# Patient Record
Sex: Male | Born: 1945
Health system: Southern US, Community
[De-identification: ages and names within clinical notes are randomized; demographics above are authoritative.]

## PROBLEM LIST (undated history)

## (undated) DIAGNOSIS — N200 Calculus of kidney: Secondary | ICD-10-CM

## (undated) DIAGNOSIS — L659 Nonscarring hair loss, unspecified: Secondary | ICD-10-CM

## (undated) DIAGNOSIS — I10 Essential (primary) hypertension: Secondary | ICD-10-CM

## (undated) DIAGNOSIS — M720 Palmar fascial fibromatosis [Dupuytren]: Secondary | ICD-10-CM

## (undated) DIAGNOSIS — K5790 Diverticulosis of intestine, part unspecified, without perforation or abscess without bleeding: Secondary | ICD-10-CM

## (undated) DIAGNOSIS — G4733 Obstructive sleep apnea (adult) (pediatric): Secondary | ICD-10-CM

## (undated) DIAGNOSIS — E785 Hyperlipidemia, unspecified: Secondary | ICD-10-CM

## (undated) DIAGNOSIS — Z9989 Dependence on other enabling machines and devices: Principal | ICD-10-CM

## (undated) DIAGNOSIS — G4730 Sleep apnea, unspecified: Secondary | ICD-10-CM

## (undated) HISTORY — DX: Essential (primary) hypertension: I10

## (undated) HISTORY — PX: OTHER SURGICAL HISTORY: SHX169

## (undated) HISTORY — DX: Nonscarring hair loss, unspecified: L65.9

## (undated) HISTORY — PX: COLONOSCOPY W/ POLYPECTOMY: SHX1380

## (undated) HISTORY — DX: Sleep apnea, unspecified: G47.30

## (undated) HISTORY — PX: WISDOM TOOTH EXTRACTION: SHX21

## (undated) HISTORY — DX: Dependence on other enabling machines and devices: Z99.89

## (undated) HISTORY — DX: Palmar fascial fibromatosis (dupuytren): M72.0

## (undated) HISTORY — DX: Calculus of kidney: N20.0

## (undated) HISTORY — DX: Obstructive sleep apnea (adult) (pediatric): G47.33

## (undated) HISTORY — DX: Hyperlipidemia, unspecified: E78.5

## (undated) HISTORY — DX: Diverticulosis of intestine, part unspecified, without perforation or abscess without bleeding: K57.90

---

## 1998-03-28 ENCOUNTER — Ambulatory Visit: Admission: RE | Admit: 1998-03-28 | Discharge: 1998-03-28 | Payer: Self-pay | Admitting: Otolaryngology

## 1999-02-04 ENCOUNTER — Encounter: Payer: Self-pay | Admitting: Internal Medicine

## 1999-02-04 ENCOUNTER — Ambulatory Visit (HOSPITAL_COMMUNITY): Admission: RE | Admit: 1999-02-04 | Discharge: 1999-02-04 | Payer: Self-pay | Admitting: Internal Medicine

## 2001-08-16 ENCOUNTER — Other Ambulatory Visit: Admission: RE | Admit: 2001-08-16 | Discharge: 2001-08-16 | Payer: Self-pay | Admitting: Gastroenterology

## 2001-08-16 ENCOUNTER — Encounter (INDEPENDENT_AMBULATORY_CARE_PROVIDER_SITE_OTHER): Payer: Self-pay | Admitting: Specialist

## 2004-09-04 ENCOUNTER — Ambulatory Visit: Payer: Self-pay | Admitting: Gastroenterology

## 2004-09-16 ENCOUNTER — Ambulatory Visit: Payer: Self-pay | Admitting: Gastroenterology

## 2005-03-20 ENCOUNTER — Ambulatory Visit: Payer: Self-pay | Admitting: Internal Medicine

## 2005-03-27 ENCOUNTER — Ambulatory Visit: Payer: Self-pay | Admitting: Internal Medicine

## 2005-09-29 ENCOUNTER — Ambulatory Visit: Payer: Self-pay | Admitting: Internal Medicine

## 2006-01-08 ENCOUNTER — Ambulatory Visit: Payer: Self-pay | Admitting: Internal Medicine

## 2006-04-20 ENCOUNTER — Ambulatory Visit: Payer: Self-pay | Admitting: Internal Medicine

## 2006-04-27 ENCOUNTER — Ambulatory Visit: Payer: Self-pay | Admitting: Internal Medicine

## 2007-01-11 ENCOUNTER — Ambulatory Visit: Payer: Self-pay | Admitting: Internal Medicine

## 2007-03-30 ENCOUNTER — Encounter: Payer: Self-pay | Admitting: Internal Medicine

## 2007-03-30 ENCOUNTER — Ambulatory Visit: Payer: Self-pay | Admitting: Internal Medicine

## 2007-03-30 LAB — CONVERTED CEMR LAB
Basophils Absolute: 0 10*3/uL (ref 0.0–0.1)
Basophils Relative: 0.2 % (ref 0.0–1.0)
Eosinophils Absolute: 0 10*3/uL (ref 0.0–0.6)
Eosinophils Relative: 0.3 % (ref 0.0–5.0)
HCT: 45 % (ref 39.0–52.0)
Hemoglobin: 15.4 g/dL (ref 13.0–17.0)
Lymphocytes Relative: 9.9 % — ABNORMAL LOW (ref 12.0–46.0)
MCHC: 34.3 g/dL (ref 30.0–36.0)
MCV: 88.4 fL (ref 78.0–100.0)
Monocytes Absolute: 0.7 10*3/uL (ref 0.2–0.7)
Monocytes Relative: 8.4 % (ref 3.0–11.0)
Neutro Abs: 7.1 10*3/uL (ref 1.4–7.7)
Neutrophils Relative %: 81.2 % — ABNORMAL HIGH (ref 43.0–77.0)
Platelets: 222 10*3/uL (ref 150–400)
RBC: 5.1 M/uL (ref 4.22–5.81)
RDW: 13.7 % (ref 11.5–14.6)
WBC: 8.7 10*3/uL (ref 4.5–10.5)

## 2007-04-05 ENCOUNTER — Telehealth (INDEPENDENT_AMBULATORY_CARE_PROVIDER_SITE_OTHER): Payer: Self-pay | Admitting: *Deleted

## 2007-04-08 ENCOUNTER — Ambulatory Visit: Payer: Self-pay | Admitting: Internal Medicine

## 2007-04-08 DIAGNOSIS — N433 Hydrocele, unspecified: Secondary | ICD-10-CM

## 2007-04-08 DIAGNOSIS — G4730 Sleep apnea, unspecified: Secondary | ICD-10-CM

## 2007-04-08 DIAGNOSIS — M720 Palmar fascial fibromatosis [Dupuytren]: Secondary | ICD-10-CM

## 2007-04-11 LAB — CONVERTED CEMR LAB
ALT: 45 units/L — ABNORMAL HIGH (ref 0–40)
AST: 25 units/L (ref 0–37)
Basophils Absolute: 0 10*3/uL (ref 0.0–0.1)
Basophils Relative: 0.6 % (ref 0.0–1.0)
Cholesterol: 246 mg/dL (ref 0–200)
Direct LDL: 156.4 mg/dL
Eosinophils Absolute: 0.2 10*3/uL (ref 0.0–0.6)
Eosinophils Relative: 2.5 % (ref 0.0–5.0)
HCT: 42.4 % (ref 39.0–52.0)
HDL: 33.8 mg/dL — ABNORMAL LOW (ref >39.0)
Hemoglobin: 14.5 g/dL (ref 13.0–17.0)
Hgb A1c MFr Bld: 5.9 % (ref 4.6–6.0)
Lymphocytes Relative: 35.7 % (ref 12.0–46.0)
MCHC: 34.2 g/dL (ref 30.0–36.0)
MCV: 89.6 fL (ref 78.0–100.0)
Monocytes Absolute: 0.6 10*3/uL (ref 0.2–0.7)
Monocytes Relative: 7.7 % (ref 3.0–11.0)
Neutro Abs: 4 10*3/uL (ref 1.4–7.7)
Neutrophils Relative %: 53.5 % (ref 43.0–77.0)
PSA: 0.28 ng/mL (ref 0.10–4.00)
Platelets: 342 10*3/uL (ref 150–400)
RBC: 4.73 M/uL (ref 4.22–5.81)
RDW: 13.4 % (ref 11.5–14.6)
TSH: 1.13 microintl units/mL (ref 0.35–5.50)
Total CHOL/HDL Ratio: 7.3
Triglycerides: 201 mg/dL (ref 0–149)
VLDL: 40 mg/dL (ref 0–40)
WBC: 7.4 10*3/uL (ref 4.5–10.5)

## 2007-04-19 ENCOUNTER — Telehealth: Payer: Self-pay | Admitting: Internal Medicine

## 2007-04-20 ENCOUNTER — Ambulatory Visit: Payer: Self-pay | Admitting: Internal Medicine

## 2007-04-20 DIAGNOSIS — M722 Plantar fascial fibromatosis: Secondary | ICD-10-CM

## 2007-04-22 ENCOUNTER — Ambulatory Visit: Payer: Self-pay | Admitting: Internal Medicine

## 2007-07-01 ENCOUNTER — Ambulatory Visit: Payer: Self-pay | Admitting: Internal Medicine

## 2007-07-05 LAB — CONVERTED CEMR LAB
ALT: 33 units/L (ref 0–53)
AST: 25 units/L (ref 0–37)
Cholesterol: 158 mg/dL (ref 0–200)
HDL: 41 mg/dL (ref >39.0)
LDL Cholesterol: 100 mg/dL — ABNORMAL HIGH (ref 0–99)
Total CHOL/HDL Ratio: 3.9
Triglycerides: 85 mg/dL (ref 0–149)
VLDL: 17 mg/dL (ref 0–40)

## 2007-07-06 ENCOUNTER — Encounter (INDEPENDENT_AMBULATORY_CARE_PROVIDER_SITE_OTHER): Payer: Self-pay | Admitting: *Deleted

## 2007-08-18 ENCOUNTER — Encounter: Payer: Self-pay | Admitting: Internal Medicine

## 2007-12-13 ENCOUNTER — Ambulatory Visit: Payer: Self-pay | Admitting: Gastroenterology

## 2007-12-27 ENCOUNTER — Ambulatory Visit: Payer: Self-pay | Admitting: Gastroenterology

## 2007-12-27 ENCOUNTER — Encounter: Payer: Self-pay | Admitting: Internal Medicine

## 2008-04-03 ENCOUNTER — Ambulatory Visit: Payer: Self-pay | Admitting: Internal Medicine

## 2008-04-04 ENCOUNTER — Encounter (INDEPENDENT_AMBULATORY_CARE_PROVIDER_SITE_OTHER): Payer: Self-pay | Admitting: *Deleted

## 2008-04-04 LAB — CONVERTED CEMR LAB
ALT: 33 units/L (ref 0–53)
AST: 33 units/L (ref 0–37)
Basophils Absolute: 0 10*3/uL (ref 0.0–0.1)
Basophils Relative: 0.6 % (ref 0.0–1.0)
Cholesterol: 164 mg/dL (ref 0–200)
Eosinophils Absolute: 0.1 10*3/uL (ref 0.0–0.7)
Eosinophils Relative: 2.4 % (ref 0.0–5.0)
HCT: 41.4 % (ref 39.0–52.0)
HDL: 41.7 mg/dL (ref >39.0)
Hemoglobin: 14.5 g/dL (ref 13.0–17.0)
Hgb A1c MFr Bld: 5.9 % (ref 4.6–6.0)
LDL Cholesterol: 107 mg/dL — ABNORMAL HIGH (ref 0–99)
Lymphocytes Relative: 33.7 % (ref 12.0–46.0)
MCHC: 34.9 g/dL (ref 30.0–36.0)
MCV: 89 fL (ref 78.0–100.0)
Monocytes Absolute: 0.5 10*3/uL (ref 0.1–1.0)
Monocytes Relative: 8.8 % (ref 3.0–12.0)
Neutro Abs: 3.1 10*3/uL (ref 1.4–7.7)
Neutrophils Relative %: 54.5 % (ref 43.0–77.0)
Platelets: 221 10*3/uL (ref 150–400)
RBC: 4.65 M/uL (ref 4.22–5.81)
RDW: 13.6 % (ref 11.5–14.6)
Total CHOL/HDL Ratio: 3.9
Triglycerides: 75 mg/dL (ref 0–149)
VLDL: 15 mg/dL (ref 0–40)
WBC: 5.6 10*3/uL (ref 4.5–10.5)

## 2008-04-05 ENCOUNTER — Ambulatory Visit: Payer: Self-pay | Admitting: Internal Medicine

## 2008-04-05 DIAGNOSIS — E785 Hyperlipidemia, unspecified: Secondary | ICD-10-CM

## 2008-08-17 ENCOUNTER — Encounter: Payer: Self-pay | Admitting: Internal Medicine

## 2008-08-18 ENCOUNTER — Telehealth (INDEPENDENT_AMBULATORY_CARE_PROVIDER_SITE_OTHER): Payer: Self-pay | Admitting: *Deleted

## 2008-08-22 ENCOUNTER — Ambulatory Visit: Payer: Self-pay | Admitting: Internal Medicine

## 2008-08-22 DIAGNOSIS — M109 Gout, unspecified: Secondary | ICD-10-CM

## 2008-08-26 LAB — CONVERTED CEMR LAB
Albumin: 4 g/dL (ref 3.5–5.2)
BUN: 19 mg/dL (ref 6–23)
CO2: 31 meq/L (ref 19–32)
Calcium: 9 mg/dL (ref 8.4–10.5)
Chloride: 104 meq/L (ref 96–112)
Creatinine, Ser: 1.1 mg/dL (ref 0.4–1.5)
GFR calc Af Amer: 87 mL/min
GFR calc non Af Amer: 72 mL/min
Glucose, Bld: 105 mg/dL — ABNORMAL HIGH (ref 70–99)
Phosphorus: 3.3 mg/dL (ref 2.3–4.6)
Potassium: 4.9 meq/L (ref 3.5–5.1)
Sodium: 141 meq/L (ref 135–145)
Uric Acid, Serum: 6.9 mg/dL (ref 4.0–7.8)

## 2008-08-29 ENCOUNTER — Encounter (INDEPENDENT_AMBULATORY_CARE_PROVIDER_SITE_OTHER): Payer: Self-pay | Admitting: *Deleted

## 2008-09-30 ENCOUNTER — Encounter: Payer: Self-pay | Admitting: Internal Medicine

## 2009-04-04 ENCOUNTER — Telehealth (INDEPENDENT_AMBULATORY_CARE_PROVIDER_SITE_OTHER): Payer: Self-pay | Admitting: *Deleted

## 2009-04-04 ENCOUNTER — Ambulatory Visit: Payer: Self-pay | Admitting: Internal Medicine

## 2009-05-09 ENCOUNTER — Ambulatory Visit: Payer: Self-pay | Admitting: Internal Medicine

## 2009-05-09 LAB — CONVERTED CEMR LAB
ALT: 38 units/L (ref 0–53)
AST: 26 units/L (ref 0–37)
Albumin: 4.1 g/dL (ref 3.5–5.2)
Alkaline Phosphatase: 58 units/L (ref 39–117)
BUN: 23 mg/dL (ref 6–23)
Basophils Absolute: 0.1 10*3/uL (ref 0.0–0.1)
Basophils Relative: 1.3 % (ref 0.0–3.0)
Bilirubin Urine: NEGATIVE
Bilirubin, Direct: 0.1 mg/dL (ref 0.0–0.3)
Blood in Urine, dipstick: NEGATIVE
CO2: 29 meq/L (ref 19–32)
Calcium: 9.3 mg/dL (ref 8.4–10.5)
Chloride: 108 meq/L (ref 96–112)
Cholesterol: 156 mg/dL (ref 0–200)
Creatinine, Ser: 1.2 mg/dL (ref 0.4–1.5)
Eosinophils Absolute: 0.1 10*3/uL (ref 0.0–0.7)
Eosinophils Relative: 2 % (ref 0.0–5.0)
GFR calc non Af Amer: 65.02 mL/min (ref >60)
Glucose, Bld: 106 mg/dL — ABNORMAL HIGH (ref 70–99)
Glucose, Urine, Semiquant: NEGATIVE
HCT: 40.7 % (ref 39.0–52.0)
HDL: 44.8 mg/dL (ref >39.00)
Hemoglobin: 14.3 g/dL (ref 13.0–17.0)
Ketones, urine, test strip: NEGATIVE
LDL Cholesterol: 87 mg/dL (ref 0–99)
Lymphocytes Relative: 32.9 % (ref 12.0–46.0)
Lymphs Abs: 2 10*3/uL (ref 0.7–4.0)
MCHC: 35.2 g/dL (ref 30.0–36.0)
MCV: 90.5 fL (ref 78.0–100.0)
Monocytes Absolute: 0.4 10*3/uL (ref 0.1–1.0)
Monocytes Relative: 7.2 % (ref 3.0–12.0)
Neutro Abs: 3.6 10*3/uL (ref 1.4–7.7)
Neutrophils Relative %: 56.6 % (ref 43.0–77.0)
Nitrite: NEGATIVE
PSA: 0.17 ng/mL (ref 0.10–4.00)
Platelets: 229 10*3/uL (ref 150.0–400.0)
Potassium: 4.9 meq/L (ref 3.5–5.1)
Protein, U semiquant: NEGATIVE
RBC: 4.5 M/uL (ref 4.22–5.81)
RDW: 13.4 % (ref 11.5–14.6)
Sodium: 144 meq/L (ref 135–145)
Specific Gravity, Urine: 1.015
TSH: 0.84 microintl units/mL (ref 0.35–5.50)
Total Bilirubin: 1 mg/dL (ref 0.3–1.2)
Total CHOL/HDL Ratio: 3
Total Protein: 7.1 g/dL (ref 6.0–8.3)
Triglycerides: 121 mg/dL (ref 0.0–149.0)
Urobilinogen, UA: 0.2
VLDL: 24.2 mg/dL (ref 0.0–40.0)
WBC Urine, dipstick: NEGATIVE
WBC: 6.2 10*3/uL (ref 4.5–10.5)
pH: 6.5

## 2009-05-17 ENCOUNTER — Ambulatory Visit: Payer: Self-pay | Admitting: Internal Medicine

## 2009-05-17 DIAGNOSIS — K5730 Diverticulosis of large intestine without perforation or abscess without bleeding: Secondary | ICD-10-CM | POA: Insufficient documentation

## 2009-05-17 DIAGNOSIS — I10 Essential (primary) hypertension: Secondary | ICD-10-CM

## 2009-05-17 DIAGNOSIS — R7309 Other abnormal glucose: Secondary | ICD-10-CM | POA: Insufficient documentation

## 2009-05-17 HISTORY — DX: Essential (primary) hypertension: I10

## 2009-05-18 ENCOUNTER — Encounter: Payer: Self-pay | Admitting: Internal Medicine

## 2009-09-10 ENCOUNTER — Telehealth (INDEPENDENT_AMBULATORY_CARE_PROVIDER_SITE_OTHER): Payer: Self-pay | Admitting: *Deleted

## 2009-09-11 ENCOUNTER — Ambulatory Visit: Payer: Self-pay | Admitting: Internal Medicine

## 2009-09-11 DIAGNOSIS — M6740 Ganglion, unspecified site: Secondary | ICD-10-CM | POA: Insufficient documentation

## 2009-12-05 ENCOUNTER — Ambulatory Visit: Payer: Self-pay | Admitting: Internal Medicine

## 2009-12-10 ENCOUNTER — Telehealth: Payer: Self-pay | Admitting: Internal Medicine

## 2010-03-11 ENCOUNTER — Encounter: Payer: Self-pay | Admitting: Internal Medicine

## 2010-05-29 ENCOUNTER — Telehealth (INDEPENDENT_AMBULATORY_CARE_PROVIDER_SITE_OTHER): Payer: Self-pay | Admitting: *Deleted

## 2010-06-17 ENCOUNTER — Telehealth: Payer: Self-pay | Admitting: Internal Medicine

## 2010-09-25 ENCOUNTER — Ambulatory Visit: Payer: Self-pay | Admitting: Internal Medicine

## 2010-09-25 LAB — CONVERTED CEMR LAB
Basophils Relative: 0.6 % (ref 0.0–3.0)
Eosinophils Relative: 2.4 % (ref 0.0–5.0)
HCT: 41.3 % (ref 39.0–52.0)
Hgb A1c MFr Bld: 5.9 % (ref 4.6–6.5)
Lymphs Abs: 1.9 10*3/uL (ref 0.7–4.0)
MCV: 90.7 fL (ref 78.0–100.0)
Monocytes Absolute: 0.5 10*3/uL (ref 0.1–1.0)
Monocytes Relative: 7.8 % (ref 3.0–12.0)
Platelets: 213 10*3/uL (ref 150.0–400.0)
RBC: 4.55 M/uL (ref 4.22–5.81)
WBC: 6.4 10*3/uL (ref 4.5–10.5)

## 2010-09-28 LAB — CONVERTED CEMR LAB
ALT: 28 units/L (ref 0–53)
AST: 21 units/L (ref 0–37)
Albumin: 4.4 g/dL (ref 3.5–5.2)
Alkaline Phosphatase: 49 units/L (ref 39–117)
BUN: 22 mg/dL (ref 6–23)
Bilirubin, Direct: 0.1 mg/dL (ref 0.0–0.3)
CO2: 21 meq/L (ref 19–32)
Calcium: 9.2 mg/dL (ref 8.4–10.5)
Chloride: 106 meq/L (ref 96–112)
Cholesterol: 164 mg/dL (ref 0–200)
Creatinine, Ser: 1.18 mg/dL (ref 0.40–1.50)
Glucose, Bld: 96 mg/dL (ref 70–99)
HDL: 48 mg/dL (ref >39)
Indirect Bilirubin: 0.4 mg/dL (ref 0.0–0.9)
LDL Cholesterol: 89 mg/dL (ref 0–99)
Potassium: 4.5 meq/L (ref 3.5–5.3)
Sodium: 143 meq/L (ref 135–145)
TSH: 1.373 microintl units/mL (ref 0.350–4.500)
Total Bilirubin: 0.5 mg/dL (ref 0.3–1.2)
Total CHOL/HDL Ratio: 3.4
Total Protein: 6.7 g/dL (ref 6.0–8.3)
Triglycerides: 136 mg/dL (ref <150)
Uric Acid, Serum: 7.5 mg/dL (ref 4.0–7.8)
VLDL: 27 mg/dL (ref 0–40)

## 2010-10-01 ENCOUNTER — Ambulatory Visit: Payer: Self-pay | Admitting: Internal Medicine

## 2010-10-01 DIAGNOSIS — Z87442 Personal history of urinary calculi: Secondary | ICD-10-CM

## 2010-10-01 DIAGNOSIS — M7660 Achilles tendinitis, unspecified leg: Secondary | ICD-10-CM | POA: Insufficient documentation

## 2010-10-02 ENCOUNTER — Encounter: Payer: Self-pay | Admitting: Internal Medicine

## 2010-12-01 LAB — CONVERTED CEMR LAB
Cholesterol, target level: 200 mg/dL
HDL goal, serum: 40 mg/dL
LDL Goal: 100 mg/dL

## 2010-12-03 NOTE — Assessment & Plan Note (Signed)
Summary: cough and congestion for 2 weeks//lh   Vital Signs:  Patient profile:   65 year old male Weight:      237 pounds Temp:     98.4 degrees F oral Pulse rate:   80 / minute Resp:     15 per minute BP sitting:   120 / 70  (left arm) Cuff size:   large  Vitals Entered By: Shonna Chock (December 05, 2009 2:43 PM) CC: Cough and congestion x 2 weeks  Comments REVIEWED MED LIST, PATIENT AGREED DOSE AND INSTRUCTION CORRECT    CC:  Cough and congestion x 2 weeks .  History of Present Illness: Onset 1-2 weeks as NP cough, ? from PNDrainage. His wife had same picture. He did take Flu shot.Rx: Mucinex DM. No PMH of asthma  Allergies (verified): No Known Drug Allergies  Review of Systems General:  Complains of sweats; denies chills and fever. ENT:  Complains of postnasal drainage; denies earache, nasal congestion, and sinus pressure; No facial pain , frontal headache, or purulence. Resp:  Denies chest pain with inspiration, shortness of breath, and wheezing.  Physical Exam  General:  well-nourished,in no acute distress; alert,appropriate and cooperative throughout examination Ears:  External ear exam shows no significant lesions or deformities.  Otoscopic examination reveals clear canals, tympanic membranes are intact bilaterally without bulging, retraction, inflammation or discharge. Hearing is grossly normal bilaterally.TMs dull Nose:  External nasal examination shows no deformity or inflammation. Nasal mucosa are  dry without lesions or exudates. Mouth:  Oral mucosa and oropharynx without lesions or exudates.  Teeth in good repair. Minimal pharyngeal erythema.   Lungs:  Normal respiratory effort, chest expands symmetrically. Lungs are clear to auscultation, no crackles or wheezes but dry cough. Cervical Nodes:  No lymphadenopathy noted Axillary Nodes:  No palpable lymphadenopathy   Impression & Recommendations:  Problem # 1:  BRONCHITIS-ACUTE (ICD-466.0)  His updated  medication list for this problem includes:    Azithromycin 250 Mg Tabs (Azithromycin) .Marland Kitchen... As per pack  Complete Medication List: 1)  Propecia Tabs (Finasteride tabs) 2)  Fish Oil  .... 2 caps qd 3)  Fiber  .... 2 caps qd 4)  Glucosamine  5)  Calcium  6)  Asa 81mg   .... 1 by mouth qd 7)  Multivitamin  .Marland Kitchen.. 1 by mouth qd 8)  Pravastatin Sodium 40 Mg Tabs (Pravastatin sodium) .Marland Kitchen.. 1 at bedtime 9)  Benazepril Hcl 20 Mg Tabs (Benazepril hcl) .Marland Kitchen.. 1 once daily if bp averages > 130/85 10)  Finasteride 5 Mg Tabs (Finasteride) .... Once daily as directed 11)  Azithromycin 250 Mg Tabs (Azithromycin) .... As per pack 12)  Fluticasone Propionate 50 Mcg/act Susp (Fluticasone propionate) .Marland Kitchen.. 1 spray two times a day  Patient Instructions: 1)  Drink as much fluid as you can tolerate for the next few days. Prescriptions: FLUTICASONE PROPIONATE 50 MCG/ACT SUSP (FLUTICASONE PROPIONATE) 1 spray two times a day  #1 x 11   Entered and Authorized by:   Marga Melnick MD   Signed by:   Marga Melnick MD on 12/05/2009   Method used:   Faxed to ...       [facility]. [phone]* (retail)       [address]       [place], [place]  [address]       Ph: [phone]       Fax: [phone]   RxID:   [id number] AZITHROMYCIN  250 MG TABS (AZITHROMYCIN) as per pack  #1 x 0   Entered and Authorized by:   Marga Melnick MD   Signed by:   Marga Melnick MD on 12/05/2009   Method used:   Faxed to ...       [facility]. [phone]* (retail)       [address]       [place], [place]  [address]       Ph: [phone]       Fax: [phone]   RxID:   [id number]

## 2010-12-03 NOTE — Assessment & Plan Note (Signed)
Summary: CPX///SPH   Vital Signs:  Patient profile:   65 year old male Height:      72 inches Weight:      233.2 pounds BMI:     31.74 Temp:     98.7 degrees F oral Pulse rate:   88 / minute Resp:     16 per minute BP sitting:   136 / 80  (left arm) Cuff size:   large  Vitals Entered By: Shonna Chock CMA (October 01, 2010 8:44 AM) CC: CPX and discuss labs (copy given)    CC:  CPX and discuss labs (copy given) .  History of Present Illness:     Mr. Soulier is here for a physical; he has had L Achilles area  pain . [facility] diagnosed torn muscle or tendon. MRI was cancelled when it improved , but it has recurred with resuming walking 60 min 4X/ week. He is taking 3000 mg of  Glucosamine daily.    Hyperlipidemia Follow-Up:  The patient denies muscle aches, GI upset, abdominal pain, flushing, itching, constipation, diarrhea, and fatigue.  The patient denies the following symptoms: chest pain/pressure, exercise intolerance, dypsnea, palpitations, syncope, and pedal edema.  Compliance with medications (by patient report) has been near 100%.  Dietary compliance has been fair.  The patient reports exercising  until the flare of the  Achilles symptoms 2 weeks ago .  Adjunctive measures currently used by the patient include fiber, ASA, and fish oil supplements.     Hypertension Follow-Up:  The patient denies lightheadedness, urinary frequency, and headaches.  Compliance with medications (by patient report) has been near 100%.  Adjunctive measures currently used by the patient include modified  salt restriction.  BP not monitored @ home; it has been 130-140/75-85 @ MD appts.  Current Medications (verified): 1)  Fish Oil .... 2 Caps Qd 2)  Fiber .... 2 Caps Qd 3)  Glucosamine 4)  Calcium 5)  Asa 81mg  .... 1 By Mouth Qd 6)  Multivitamin .Marland Kitchen.. 1 By Mouth Qd 7)  Pravastatin Sodium 40 Mg  Tabs (Pravastatin Sodium) .Marland Kitchen.. 1 At Bedtime, **appointment Due** 8)  Benazepril Hcl 20 Mg Tabs  (Benazepril Hcl) .Marland Kitchen.. 1 Once Daily If Bp Averages > 130/85 9)  Finasteride 5 Mg Tabs (Finasteride) .... Once Daily As Directed  Allergies: No Known Drug Allergies  Past History:  Past Medical History: CPAP for Sleep Apnea Dupuytren's contracture RUE  Hyperlipidemia: LDL goal = < 100 Gout Diverticulosis, colon Nephrolithiasis, hx of, 1984 & 2011, Dr Aldean Ast  Past Surgical History: Colon polypectomy X 2; negative  10/2007, Dr Jarold Motto ( due 2013) renal calculus retrieved via basket 1984;stone passed spontaneously 2011; Otic tube R TM  Family History: MGM: HTN, CVA;MGF : lung cancer; P & M uncle : MI  (neither pre 46) Father:  lung & throat  cancer Mother: uremia from medications ([place]) Siblings: negative  Social History: Former Games developer, former smokeless tobacco user Alcohol use-yes-socially Occupation:Executive Married Regular exercise-yes: 4X/week X 1 hr  Review of Systems  The patient denies anorexia, fever, vision loss, decreased hearing, hoarseness, prolonged cough, hemoptysis, abdominal pain, melena, hematochezia, severe indigestion/heartburn, hematuria, suspicious skin lesions, depression, unusual weight change, abnormal bleeding, enlarged lymph nodes, and angioedema.         Weigh gain of 5 #.  Physical Exam  General:  well-nourished;alert,appropriate and cooperative throughout examination Head:  Normocephalic and atraumatic without obvious abnormalities.  Eyes:  No corneal or conjunctival inflammation noted.  Perrla. Funduscopic exam benign, without  hemorrhages, exudates or papilledema. Ears:  External ear exam shows no significant lesions or deformities.  Otoscopic examination reveals clear canals, tympanic membranes are intact bilaterally without bulging, retraction, inflammation or discharge. R TM scarred. Hearing is grossly normal bilaterally. Nose:  External nasal examination shows no deformity or inflammation. Nasal mucosa are pink and moist without  lesions or exudates. Mouth:  Oral mucosa and oropharynx without lesions or exudates.  Teeth in good repair. Neck:  No deformities, masses, or tenderness noted. Lungs:  Normal respiratory effort, chest expands symmetrically. Lungs are clear to auscultation, no crackles or wheezes. Heart:  Normal rate and regular rhythm. S1 and S2 normal without gallop, murmur, click, rub. S4  Abdomen:  Bowel sounds positive,abdomen soft and non-tender without masses, organomegaly or hernias noted. Genitalia:  Dr Aldean Ast Msk:  No deformity or scoliosis noted of thoracic or lumbar spine.   Pulses:  R and L carotid,radial,dorsalis pedis and posterior tibial pulses are full and equal bilaterally Extremities:  No clubbing, cyanosis, edema. Slightly tender mass L Achilles tendon, ? ganglion. Dupuytren's R thumb base Neurologic:  alert & oriented X3 and DTRs symmetrical and normal.   Skin:  Intact without suspicious lesions or rashes Cervical Nodes:  No lymphadenopathy noted Axillary Nodes:  No palpable lymphadenopathy Psych:  memory intact for recent and remote, normally interactive, and good eye contact.     Impression & Recommendations:  Problem # 1:  ROUTINE GENERAL MEDICAL EXAM@HEALTH  CARE FACL (ICD-V70.0)  Orders: EKG w/ Interpretation (93000)  Problem # 2:  ACHILLES TENDINITIS (ICD-726.71) vs ganglion  Problem # 3:  UNSPECIFIED ESSENTIAL HYPERTENSION (ICD-401.9) controlled His updated medication list for this problem includes:    Benazepril Hcl 20 Mg Tabs (Benazepril hcl) .Marland Kitchen... 1 once daily  Problem # 4:  HYPERLIPIDEMIA (ICD-272.4)  His updated medication list for this problem includes:    Pravastatin Sodium 40 Mg Tabs (Pravastatin sodium) .Marland Kitchen... 1 at bedtime  Problem # 5:  GOUT (ICD-274.9) PMH of, uric acid 7.5  Problem # 6:  NEPHROLITHIASIS, HX OF (ICD-V13.01)  Complete Medication List: 1)  Fish Oil  .... 2 caps qd 2)  Fiber  .... 2 caps qd 3)  Glucosamine  4)  Calcium  5)  Asa 81mg    .... 1 by mouth qd 6)  Multivitamin  .Marland Kitchen.. 1 by mouth qd 7)  Pravastatin Sodium 40 Mg Tabs (Pravastatin sodium) .Marland Kitchen.. 1 at bedtime 8)  Benazepril Hcl 20 Mg Tabs (Benazepril hcl) .Marland Kitchen.. 1 once daily 9)  Finasteride 5 Mg Tabs (Finasteride) .... Once daily as directed 10)  Allopurinol 100 Mg Tabs (allopurinol)  .Marland Kitchen.. 1 daily  Patient Instructions: 1)  Check uric acid level in 3-4 months (274.9) on  low dose Allopurinol. 2)  Check your Blood Pressure regularly. If it is above: 135/85 ON AVERAGE you should make an appointment. Prescriptions: ALLOPURINOL 100 MG TABS (ALLOPURINOL) 1 daily  #90 x 1   Entered and Authorized by:   Marga Melnick MD   Signed by:   Marga Melnick MD on 10/01/2010   Method used:   Print then Give to Patient   RxID:   [id number] BENAZEPRIL HCL 20 MG TABS (BENAZEPRIL HCL) 1 once daily  #90 x 3   Entered and Authorized by:   Marga Melnick MD   Signed by:   Marga Melnick MD on 10/01/2010   Method used:   Print then Give to Patient   RxID:   [id number] PRAVASTATIN SODIUM 40 MG  TABS (PRAVASTATIN SODIUM) 1 at bedtime  #  90 x 3   Entered and Authorized by:   Marga Melnick MD   Signed by:   Marga Melnick MD on 10/01/2010   Method used:   Print then Give to Patient   RxID:   [id number]    Orders Added: 1)  Est. Patient 40-64 years [99396] 2)  EKG w/ Interpretation [93000]

## 2010-12-03 NOTE — Progress Notes (Signed)
Summary: RX-request chest congestion  z-pak  Phone Note Call from Patient Call back at [phone]   Caller: Patient Summary of Call: pt left VM that he current has alot of chest congestion and would like for dr Areg Bialas to rx a z-pak for him. pt states that he currently out of town and would need rx sent to [facility]. Called pt back to inform him med cannot be rx withoul [facility] and he would need to be seen at local UC  for Z-pak to be rx. pt states that dr Tiphani Mells [place] and would prefer i speak with him first beacuse he is a friend of dr Blimi Godby. pls advise................Marland KitchenFelecia Deloach CMA  June 17, 2010 10:44 AM   Follow-up for Phone Call        per dr Brehanna Deveny ok generic z-pak OVINB.........Marland KitchenFelecia Deloach CMA  June 17, 2010 2:39 PM   pt aware rx faxed [phone] ph [phone]............Marland KitchenFelecia Deloach CMA  June 17, 2010 2:47 PM     New/Updated Medications: ZITHROMAX Z-PAK 250 MG TABS (AZITHROMYCIN) as directed Prescriptions: ZITHROMAX Z-PAK 250 MG TABS (AZITHROMYCIN) as directed  #1 x 0   Entered by:   Jeremy Johann CMA   Authorized by:   Marga Melnick MD   Signed by:   Jeremy Johann CMA on 06/17/2010   Method used:   Printed then faxed to ...       [facility]. [phone]* (retail)       [address]       [place], [place]  [address]       Ph: [phone]       Fax: [phone]   RxID:   [id number]

## 2010-12-03 NOTE — Progress Notes (Signed)
Summary: NEEDS CPX FASTING LABS --entered  Phone Note Call from Patient   Caller: Patient Summary of Call: PATIENT HAS CPX ON 11/29 AND HAS FASTING LABS FOR 11/23----WHAT FASTING LABS WILL HE NEED?  LET ME KNOW AND I WILL ENTER IT ON HIS LAB  Initial call taken by: Jerolyn Shin,  May 29, 2010 2:33 PM  Follow-up for Phone Call        V70.0, 401.9, 274.9, 790.29: lipids, hep panel, BMET, CBC& dif, TSH, A1c, uric acid, PSA Follow-up by: Marga Melnick MD,  May 29, 2010 3:45 PM  Additional Follow-up for Phone Call Additional follow up Details #1::        entered lab info for 11/23 Additional Follow-up by: Jerolyn Shin,  May 29, 2010 5:39 PM

## 2010-12-03 NOTE — Letter (Signed)
Summary: [facility]  [facility]   Imported By: Lanelle Bal 03/18/2010 14:01:58  _____________________________________________________________________  External Attachment:    Type:   Image     Comment:   External Document

## 2010-12-03 NOTE — Progress Notes (Signed)
Summary: COUGHING  Phone Note Call from Patient Call back at [phone]   Caller: Patient Reason for Call: Talk to Nurse Summary of Call: PT CALL AND SAID THAT HE STARTED  COUGHING SO BAD THAT HE PAST OUT AND WANT TO KNOW WHAT HE SHOULD DO ABOUT THIS AND WANT TO TALK TO DR. Catelynn Sparger ABOUT THIS. HE WAS IN ONE DAY LAST WEEK TO SEE DR. HOP  FOTR COUGHING. Initial call taken by: Freddy Jaksch,  December 10, 2009 11:47 AM  Follow-up for Phone Call        pt states that he was seen on 12-05-09 for BRONCHITIS and has finished the z-pak that was rx. pt still having cough pls advise..............Marland KitchenFelecia Deloach CMA  December 10, 2009 1:14 PM   Additional Follow-up for Phone Call Additional follow up Details #1::        Zithromax will be in system for 3 more days. See Rx for cough syrup Additional Follow-up by: Marga Melnick MD,  December 10, 2009 1:30 PM    Additional Follow-up for Phone Call Additional follow up Details #2::    per pt did not need rx already had one from [facility] that was hand written from dr Jadalynn Burr pt will continue with med and if no better will call...............Marland KitchenFelecia Deloach CMA  December 10, 2009 2:18 PM   New/Updated Medications: PROMETHAZINE VC/CODEINE 6.25-5-10 MG/5ML SYRP (PHENYLEPH-PROMETHAZINE-COD) 1 tsp q 6 hrs prn Prescriptions: PROMETHAZINE VC/CODEINE 6.25-5-10 MG/5ML SYRP (PHENYLEPH-PROMETHAZINE-COD) 1 tsp q 6 hrs prn  #120cc x 0   Entered and Authorized by:   Marga Melnick MD   Signed by:   Marga Melnick MD on 12/10/2009   Method used:   Printed then faxed to ...       [facility]. [phone]* (retail)       [address]       [place], [place]  [address]       Ph: [phone]       Fax: [phone]   RxID:   [id number]

## 2010-12-25 ENCOUNTER — Telehealth (INDEPENDENT_AMBULATORY_CARE_PROVIDER_SITE_OTHER): Payer: Self-pay | Admitting: *Deleted

## 2010-12-31 NOTE — Progress Notes (Signed)
Summary: PSA  Phone Note Call from Patient Call back at [phone]   Caller: Patient Summary of Call: Patient is calling about his psa. Was it done and why wasn't it done. Initial call taken by: Freddy Jaksch,  December 25, 2010 9:58 AM  Follow-up for Phone Call        I see this was 0.17 in 2010. Please advise. Lucious Groves CMA  December 25, 2010 10:06 AM   Additional Follow-up for Phone Call Additional follow up Details #1::        I thought Dr Vic Blackbird had done this when seen in 2011; it was last done here 05/2009 Additional Follow-up by: Marga Melnick MD,  December 25, 2010 11:26 AM    Additional Follow-up for Phone Call Additional follow up Details #2::    I spoke with patient's wife and informed her of Dr.Hopper's response and she informed me to contact her Husband at his work number.   I called patient and work number [facility]) and then called cell ([phone] number) and spoke with patient. Patient stated he will call back to schedule PSA( v76.44).Shonna Chock CMA  December 25, 2010 11:43 AM

## 2011-03-21 NOTE — Assessment & Plan Note (Signed)
[facility]                        [facility] OFFICE NOTE   [name], PARKISON                    MRN:          [id number]  DATE:01/11/2007                            DOB:          31-Jan-1946    Danny Kennedy was seen on January 11, 2007, with Classic symptoms and signs  of rhinosinusitis. For two weeks he has had congestion and purulence in  both head and chest, greater from the sinuses. Cough has disturbed  sleep. He also has had associated nasal congestion and fatigue. He had  fever only on day 1. He denies headache, halitosis, nasal obstruction,  facial pain, anosmia, dental pain or earache.   He has treated himself appropriately with Mucinex and Delsym.   He has no known drug allergies.   He quit smoking in 1992.   Significant history of sleep apnea for which he uses CPAP.   Temperature was 97.6, weight 229-up 5 pounds, respiratory rate 15, blood  pressure 120/80.  He had full extraocular motion.  There is scarring of the right tympanic membrane; by history he had a  tube in this ear. The left tympanic membrane is slightly dull.  Nares are dry.  There is only minimal erythema of the posterior pharynx.  He has no lymphadenopathy  of the head, neck or axilla.  Chest was clear.   Augmentin 875 every 12 hours with a meal would be recommended. Continue  the Mucinex and forced oral hydration will be encouraged. For the nasal  obstruction, a NETI pot would be recommended. Tussionex will be provided  at bedtime to allow sleep.     Titus Dubin. Alwyn Ren, MD,FACP,FCCP  Electronically Signed    [facility]  DD: 01/11/2007  DT: 01/11/2007  Job #: [id number]

## 2011-04-22 ENCOUNTER — Other Ambulatory Visit: Payer: Self-pay | Admitting: Internal Medicine

## 2011-04-22 NOTE — Telephone Encounter (Signed)
Check uric acid level in 3-4 months (274.9) on  low dose Allopurinol.  Copied from 10/01/2010 patient instructions

## 2011-06-03 ENCOUNTER — Other Ambulatory Visit: Payer: Self-pay | Admitting: Internal Medicine

## 2011-07-14 ENCOUNTER — Telehealth: Payer: Self-pay | Admitting: Internal Medicine

## 2011-07-14 NOTE — Telephone Encounter (Signed)
Spoke with Pt who states that he needs to have a new mask for his sleep apnea machine. Pt note that he has recently change insurance and will contact them and return call tomorrow to clarify exactly what is needed from dr hopper.

## 2011-07-17 ENCOUNTER — Ambulatory Visit: Payer: Self-pay | Admitting: Internal Medicine

## 2011-07-21 ENCOUNTER — Encounter: Payer: Self-pay | Admitting: *Deleted

## 2011-07-21 ENCOUNTER — Ambulatory Visit (INDEPENDENT_AMBULATORY_CARE_PROVIDER_SITE_OTHER): Payer: Medicare Other | Admitting: Internal Medicine

## 2011-07-21 DIAGNOSIS — I10 Essential (primary) hypertension: Secondary | ICD-10-CM

## 2011-07-21 DIAGNOSIS — G4730 Sleep apnea, unspecified: Secondary | ICD-10-CM

## 2011-07-21 NOTE — Patient Instructions (Signed)
Your BP goal = AVERAGE < 135/85. Avoid ingestion of  excess salt/sodium.Cook with pepper & other spices . Use the salt substitute "No Salt"(unless your potassium has been elevated) OR the Mrs Sharilyn Sites products to season food @ the table. Avoid foods which taste salty or "vinegary" as their sodium contentet will be high. The best exercises for the low back include freestyle swimming, stretch aerobics, and yoga. The CPAP facemask.  Please take this record to support reevaluation by [employer] in  reference to CPAP mask

## 2011-07-21 NOTE — Progress Notes (Signed)
  Subjective:    Patient ID: Danny Kennedy, male    DOB: May 24, 1946, 65 y.o.   MRN: [id number]  HPI   Sleep apnea was documented on formal testing in 2002. In 2008 the pressure was increased from 7 cm to 11 cm because of suboptimal control of apnea. His sleep pattern is extremely good at this time, but he is noted an indentation of the subcutaneous tissues above the bridge of the nose over the past 6 months. He attributes this to the present mask he is using     Review of Systems he denies chest pain, palpitations, dyspnea, paroxysmal nocturnal dyspnea, or edema.   he is taking Celebrex from his Orthopedist  for pain from acute low back syndrome incurred last week. He feels this is raising BP      Objective:   Physical Exam he is a healthy in appearance a well-nourished  There is an indentation subcutaneously above the bridge of the nose. There is no evidence of cellulitis or rash.  He has no neck vein distention or hepatojugular reflux  Nares are patent without septal dislocation  Otic canals and tympanic membranes are normal except for scarring on the right  Dental hygiene is excellent; there is a crowding of the oropharynx  There is  regular rhythm without murmurs or gallops  Chest is clear to auscultation with no increased work of breathing.  No clubbing, cyanosis or edema present        Assessment & Plan:  #1 sleep apnea, excellent response to present settings  #2 subcutaneous tissue deficit due to to his present mask  #3 elevated blood pressure in context acute low back injury.   Plan: referral for equipment reassessment.

## 2011-09-17 ENCOUNTER — Encounter: Payer: Self-pay | Admitting: Internal Medicine

## 2011-09-18 ENCOUNTER — Encounter: Payer: Self-pay | Admitting: Internal Medicine

## 2011-09-18 ENCOUNTER — Ambulatory Visit (INDEPENDENT_AMBULATORY_CARE_PROVIDER_SITE_OTHER): Payer: Medicare Other | Admitting: Internal Medicine

## 2011-09-18 VITALS — BP 122/74 | HR 81 | Temp 98.7°F | Resp 12 | Ht 71.0 in | Wt 230.0 lb

## 2011-09-18 DIAGNOSIS — Z86010 Personal history of colonic polyps: Secondary | ICD-10-CM

## 2011-09-18 DIAGNOSIS — I10 Essential (primary) hypertension: Secondary | ICD-10-CM

## 2011-09-18 DIAGNOSIS — M545 Low back pain: Secondary | ICD-10-CM

## 2011-09-18 DIAGNOSIS — R7309 Other abnormal glucose: Secondary | ICD-10-CM

## 2011-09-18 DIAGNOSIS — Z0000 Encounter for general adult medical examination without abnormal findings: Secondary | ICD-10-CM

## 2011-09-18 DIAGNOSIS — M109 Gout, unspecified: Secondary | ICD-10-CM

## 2011-09-18 DIAGNOSIS — E785 Hyperlipidemia, unspecified: Secondary | ICD-10-CM

## 2011-09-18 NOTE — Progress Notes (Signed)
Subjective:    Patient ID: Danny Kennedy, male    DOB: [birth year], 65 y.o.   MRN: [id number]  HPI Medicare Wellness Visit:  The following psychosocial & medical history were reviewed as required by Medicare.   Social history: caffeine: 1 diet coke  , alcohol:  < 1 / day ,  tobacco use : quit smoking  1992 & smokeless tobacco 2008 ;   & exercise : walking 3-4 X/ week > 60 min.   Home & personal  safety / fall risk: no issues, activities of daily living: no limitations , seatbelt use : yes , and smoke alarm employment : yes .  Power of Attorney/Living Will status : in place  Vision ( as recorded per Nurse) & Hearing  evaluation :  Last week , early cataract.Whisper heard @ 6 ft; wall chart read @ 6 ft Orientation :oriented X 3 , memory & recall :good ,  math testing: good,and mood & affect : normal . Depression / anxiety: no issues Travel history : last 2009 [place] , immunization status :Flu not taken  , transfusion history:  no, and preventive health surveillance ( colonoscopies, BMD , etc as per protocol/ [facility]): colonoscopy up to date, Dental care:  Seen every 6 mos . Chart reviewed &  Updated. Active issues reviewed & addressed.       Review of Systems BACK  PAIN: Location: LS area   Onset: today   Severity: up to 7 Pain is described as: sharp  Worse with: straightening back    Better with: Celebrex  Pain radiates to: no   Impaired range of motion: yes History of repetitive motion:  no  History of overuse or hyperextension:  no  History of trauma:  no   Past history of similar problem:  yes, chronic / recurrent Symptoms Numbness/tingling:  no  Weakness:  no  Red Flags Fever:  no  Bowel/bladder dysfunction:  no       Objective:   Physical Exam Gen.: Healthy and well-nourished in appearance. Alert, appropriate and cooperative throughout exam. Head: Normocephalic without obvious abnormalities  Eyes: No corneal or conjunctival inflammation noted. Pupils equal round  reactive to light and accommodation. Fundal exam is benign without hemorrhages, exudate, papilledema. Extraocular motion intact.  Ears: R TM scarred  Nose: External nasal exam reveals no deformity or inflammation. Nasal mucosa are pink and moist. No lesions or exudates noted. Septum  Minimally dislocated  Mouth: Oral mucosa and oropharynx reveal no lesions or exudates. Teeth in good repair. Neck: No deformities, masses, or tenderness noted. Range of motion &. Thyroid  normal. Lungs: Normal respiratory effort; chest expands symmetrically. Lungs are clear to auscultation without rales, wheezes, or increased work of breathing. Heart: Normal rate and rhythm. Normal S1 and S2. No gallop, click, or rub. S 4 without  murmur. Abdomen: Bowel sounds normal; abdomen soft and nontender. No masses, organomegaly or hernias noted. Genitalia/ DRE: He has varicoele in the left scrotum. The prostate is broad and flat without asymmetry,enlargement, nodularity, or induration.    Marland Kitchen  Musculoskeletal/extremities: No deformity or scoliosis noted of  the thoracic or lumbar spine. No clubbing, cyanosis, edema, or deformity noted. Range of motion  normal .Tone & strength  normal.Joints normal. Nail health  good. He moves slowly but is able to lay  back and sit up without help. Straight leg raising is negative. He does have some pain in the left shoulder position but strength is good Vascular: Carotid, radial artery, dorsalis pedis and  posterior tibial pulses are full and equal. No bruits present. Neurologic: Alert and oriented x3. Deep tendon reflexes symmetrical and normal.          Skin: Intact without suspicious lesions or rashes. Lymph: No cervical, axillary, or inguinal lymphadenopathy present. Psych: Mood and affect are normal. Normally interactive                                                                                           Assessment & Plan:  #1 Medicare Wellness Exam; criteria met ; data entered #2 Problem List reviewed ; Assessment/ Recommendations made   #3  low back syndrome, acute on chronic Plan: see Orders    EKG is normal; the computer described low voltage in limb leads.

## 2011-09-18 NOTE — Patient Instructions (Signed)
Preventive Health Care: Exercise at least 30-45 minutes a day,  3-4 days a week.  Eat a low-fat diet with lots of fruits and vegetables, up to 7-9 servings per day. Consume less than 40 grams of sugar per day from foods & drinks with High Fructose Corn Sugar as # 1,2,3 or # 4 on label. The best exercises for the low back include freestyle swimming, stretch aerobics, and yoga. Please  schedule fasting Labs : BMET,Lipids, hepatic panel, CBC & dif, TSH.  Please bring these instructions to that Lab appt.

## 2011-09-19 ENCOUNTER — Other Ambulatory Visit: Payer: Self-pay | Admitting: Internal Medicine

## 2011-09-19 ENCOUNTER — Other Ambulatory Visit (INDEPENDENT_AMBULATORY_CARE_PROVIDER_SITE_OTHER): Payer: Medicare Other

## 2011-09-19 DIAGNOSIS — E785 Hyperlipidemia, unspecified: Secondary | ICD-10-CM

## 2011-09-19 DIAGNOSIS — R7309 Other abnormal glucose: Secondary | ICD-10-CM

## 2011-09-19 DIAGNOSIS — M109 Gout, unspecified: Secondary | ICD-10-CM

## 2011-09-19 DIAGNOSIS — Z23 Encounter for immunization: Secondary | ICD-10-CM

## 2011-09-19 DIAGNOSIS — D126 Benign neoplasm of colon, unspecified: Secondary | ICD-10-CM

## 2011-09-19 LAB — URIC ACID: Uric Acid, Serum: 6.9 mg/dL (ref 4.0–7.8)

## 2011-09-19 LAB — LIPID PANEL
Cholesterol: 173 mg/dL (ref 0–200)
VLDL: 37.2 mg/dL (ref 0.0–40.0)

## 2011-09-19 LAB — HEPATIC FUNCTION PANEL
ALT: 32 U/L (ref 0–53)
AST: 22 U/L (ref 0–37)
Alkaline Phosphatase: 49 U/L (ref 39–117)
Bilirubin, Direct: 0 mg/dL (ref 0.0–0.3)
Total Protein: 7.3 g/dL (ref 6.0–8.3)

## 2011-09-19 NOTE — Progress Notes (Signed)
12  

## 2011-10-07 ENCOUNTER — Encounter: Payer: Self-pay | Admitting: Internal Medicine

## 2011-10-07 ENCOUNTER — Ambulatory Visit (INDEPENDENT_AMBULATORY_CARE_PROVIDER_SITE_OTHER): Payer: Medicare Other | Admitting: Internal Medicine

## 2011-10-07 VITALS — BP 128/76 | HR 95 | Temp 99.1°F | Wt 231.2 lb

## 2011-10-07 DIAGNOSIS — J069 Acute upper respiratory infection, unspecified: Secondary | ICD-10-CM

## 2011-10-07 DIAGNOSIS — J209 Acute bronchitis, unspecified: Secondary | ICD-10-CM

## 2011-10-07 MED ORDER — AMOXICILLIN 500 MG PO CAPS: 500.0000 mg | ORAL_CAPSULE | Freq: Three times a day (TID) | ORAL | Status: AC

## 2011-10-07 MED ORDER — HYDROCODONE-HOMATROPINE 5-1.5 MG/5ML PO SYRP: 5.0000 mL | ORAL_SOLUTION | Freq: Four times a day (QID) | ORAL | Status: AC | PRN

## 2011-10-07 NOTE — Patient Instructions (Signed)
Plain Mucinex for thick secretions ;force NON dairy fluids. Use a Neti pot daily as needed for sinus congestion  

## 2011-10-07 NOTE — Progress Notes (Signed)
  Subjective:    Patient ID: Danny Kennedy, male    DOB: 1946-09-14, 65 y.o.   MRN: [id number]  HPI Respiratory tract infection Onset/symptoms:11/26 as head congestion Exposures (illness/environmental/extrinsic):family & friends @ Thanksgiving Progression of symptoms:to chest symptoms as of 11/28 Treatments/response:Zicam, Mucinex DM with response Present symptoms: Fever/chills/sweats:no  Frontal headache:no Facial pain:no Nasal purulence:brown Sore throat:no Dental pain:no Lymphadenopathy:no Wheezing/shortness of breath:no Cough/sputum/hemoptysis:brown, > volume from chest than head Pleuritic pain:no  Past medical history: Seasonal allergies; no/asthma:no Smoking history:quit 1992           Review of Systems     Objective:   Physical Exam General appearance is of good health and nourishment; no acute distress or increased work of breathing is present.  No  lymphadenopathy about the head, neck, or axilla noted.   Eyes: No conjunctival inflammation or lid edema is present.   Ears:  External ear exam shows no significant lesions or deformities.  Otoscopic examination reveals clear canals, tympanic membranes are intact bilaterally without bulging, retraction, inflammation or discharge.  Nose:  External nasal examination shows no deformity or inflammation. Nasal mucosa are pink and moist without lesions or exudates. No septal dislocation .No obstruction to airflow.   Oral exam: Dental hygiene is good; lips and gums are healthy appearing.There is no oropharyngeal erythema or exudate noted.    Heart:  Normal rate and regular rhythm. S1 and S2 normal without gallop, murmur, click, rub or other extra sounds.   Lungs:Chest clear to auscultation; no wheezes, rhonchi,rales ,or rubs present.No increased work of breathing.    Extremities:  No cyanosis, edema, or clubbing  noted    Skin: Warm & dry w/o jaundice or tenting.         Assessment & Plan:  #1 bronchitis,  purulent  #2 upper respiratory tract infection without definitive criteria for rhinosinusitis  Plan: See orders recommendations

## 2011-10-10 ENCOUNTER — Telehealth: Payer: Self-pay | Admitting: Internal Medicine

## 2011-10-10 NOTE — Telephone Encounter (Signed)
Patient was seen tues [id number] - he was given antibiotic - he said he still has a temp - wants to know what he can take

## 2011-10-10 NOTE — Telephone Encounter (Signed)
Spoke with patient, patient informed to alternate tylenol and motrin every 4-6 hours until fever breaks for 24 hours. Patient agreed to instruction

## 2011-10-20 ENCOUNTER — Other Ambulatory Visit: Payer: Self-pay | Admitting: Internal Medicine

## 2011-11-27 ENCOUNTER — Telehealth: Payer: Self-pay | Admitting: Internal Medicine

## 2011-11-27 NOTE — Telephone Encounter (Signed)
Patient states that he would like to talk to a nurse or Dr. Alwyn Ren. Patient would like to be referred to a neurosurgeon. 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

## 2011-11-27 NOTE — Telephone Encounter (Signed)
Patient returned phone call. Best # [phone]

## 2011-11-27 NOTE — Telephone Encounter (Signed)
Discuss with patient  

## 2011-11-27 NOTE — Telephone Encounter (Signed)
Left message to call office

## 2011-11-27 NOTE — Telephone Encounter (Signed)
Dr. Stern

## 2011-11-27 NOTE — Telephone Encounter (Signed)
Spoke with patient, patient would like the name of a neurosurgeon that Dr.Hopper recommends   Dr.Hopper please advise

## 2012-03-02 ENCOUNTER — Telehealth: Payer: Self-pay | Admitting: Internal Medicine

## 2012-03-02 NOTE — Telephone Encounter (Signed)
It would depend on whether exam suggested a neurosurgical condition which might require intervention or nonoperative process such as spinal stenosis which would be treated with exercises or possible epidural steroid injections .It is  impossible to say whether neurosurgery or the nonoperative back specialist is the appropriate route w/o exam.

## 2012-03-02 NOTE — Telephone Encounter (Signed)
Patient called stating he would like a referral for his back pain. Patient states he is having consistent lowe back pain that goes down his left leg Patient ph# [phone]

## 2012-03-02 NOTE — Telephone Encounter (Signed)
I am sorry but specialists & insurance organizations  require an updated, current  assessment and written note from the Primary Care physician  to review before they  schedule an appointment to assess symptoms or problems. If we do not have such  a current  assessment of your health issue or complaint in the chart (electronic medical record);you will need to  make an appointment to create this document THEY REQUIRE. It will be necessary to know prior evaluations and treatments of this symptom and response to these interventions. Please bring that medical history & all medications & supplements to that appointment so I can complete the required document.

## 2012-03-02 NOTE — Telephone Encounter (Signed)
Left message to call office

## 2012-03-02 NOTE — Telephone Encounter (Signed)
Pt would like to know who Dr Alwyn Ren recommend he see about his back. Pt will contact insurance to see if they require that he have a referral. .Please advise

## 2012-03-02 NOTE — Telephone Encounter (Signed)
Dr.Hopper please advise if OV needed prior to referral, if yes please send to scheduler

## 2012-03-03 NOTE — Telephone Encounter (Signed)
Discuss with patient, appt scheduled. 

## 2012-03-04 ENCOUNTER — Ambulatory Visit (INDEPENDENT_AMBULATORY_CARE_PROVIDER_SITE_OTHER): Payer: Medicare Other | Admitting: Internal Medicine

## 2012-03-04 ENCOUNTER — Ambulatory Visit: Payer: Medicare Other | Admitting: Internal Medicine

## 2012-03-04 ENCOUNTER — Encounter: Payer: Self-pay | Admitting: Internal Medicine

## 2012-03-04 VITALS — BP 120/80 | HR 86 | Temp 98.3°F | Wt 225.8 lb

## 2012-03-04 DIAGNOSIS — M545 Low back pain, unspecified: Secondary | ICD-10-CM

## 2012-03-04 LAB — POCT URINALYSIS DIPSTICK
Blood, UA: NEGATIVE
Protein, UA: NEGATIVE
Spec Grav, UA: 1.015
Urobilinogen, UA: 0.2
pH, UA: 6

## 2012-03-04 MED ORDER — TRAMADOL HCL 50 MG PO TABS: 50.0000 mg | ORAL_TABLET | Freq: Four times a day (QID) | ORAL | Status: DC | PRN

## 2012-03-04 MED ORDER — CYCLOBENZAPRINE HCL 5 MG PO TABS: ORAL_TABLET | ORAL | Status: DC

## 2012-03-04 NOTE — Patient Instructions (Signed)
Order for x-rays entered into  the computer; these will be performed at [address] [facility]. across from [facility]. No appointment is necessary. The best exercises for the low back include freestyle swimming, stretch aerobics, and yoga.  Please try to go on My Chart within the next 24 hours to allow me to release the results directly to you.

## 2012-03-04 NOTE — Progress Notes (Signed)
  Subjective:    Patient ID: Danny Kennedy, male    DOB: November 26, 1945, 66 y.o.   MRN: [id number]  HPI  He describes an aching lumbosacral area pain which has been present for 3-4 months intermittently. It can be as severe as a 5/10 scale. It can radiate to the left upper buttocks area as well as the left posterior leg as far down as the lower thigh and left anterior medial thigh area. He also has some muscle spasms in the right lumbosacral area at times.  When present it can last hours to days. It is worse when he tries to stand after sitting for a period of time. It has been aggravated by him sitting on the ground during [place] season.  Aleve and lidocaine patches have been of benefit.  There is no history of any specific injury or trigger other than lifting intermittently through the years. Past medical history/family history/social history were all reviewed and updated. Pertinent data: Past medical history renal calculi and gout    Review of Systems  Fecal/urinary incontinence: no Numbness/Weakness: no  Fever/chills/sweats: no  Night pain: occasionally Unexplained weight loss: no  No relief with bedrest:usually, if in either LDP   PMH of osteoporosis or chronic steroid use: no       Objective:   Physical Exam Gen.:  well-nourished in appearance. Alert, appropriate and cooperative throughout exam.  Neck: No deformities, masses, or tenderness noted. Range of motion normal  Abdomen: Bowel sounds normal; abdomen soft and nontender. No masses, organomegaly or hernias noted.No AAA                                                                              Musculoskeletal/extremities: No deformity or scoliosis noted of  the thoracic or lumbar spine. No clubbing, cyanosis, edema, or deformity noted. Range of motion  normal .Tone & strength  normal.Joints normal. Nail health  good. He is able to lie flat and sit up without help. Straight leg raising is negative. No pain to percussion over  the area of pain in the lumbosacral area Vascular:  dorsalis pedis and  posterior tibial pulses are full and equal. No bruits present. Neurologic: Alert and oriented x3. Deep tendon reflexes symmetrical and normal. Gait is normal to include tiptoe and heel walking.          Skin: Intact without suspicious lesions or rashes. Lymph: No cervical, axillary lymphadenopathy present. Psych: Mood and affect are normal. Normally interactive                                                                                         Assessment & Plan:  #1 low back syndrome; no evidence of ruptured disc  Plan: Imaging; muscle relaxant; and pain medication. Potentially helpful options would be physical therapy or chiropractry

## 2012-03-15 ENCOUNTER — Ambulatory Visit (INDEPENDENT_AMBULATORY_CARE_PROVIDER_SITE_OTHER)
Admission: RE | Admit: 2012-03-15 | Discharge: 2012-03-15 | Disposition: A | Discharge status: Home / Self Care | Payer: Medicare Other | Source: Ambulatory Visit | Attending: Internal Medicine | Admitting: Internal Medicine

## 2012-03-15 DIAGNOSIS — M545 Low back pain: Secondary | ICD-10-CM

## 2012-03-15 DIAGNOSIS — M47817 Spondylosis without myelopathy or radiculopathy, lumbosacral region: Secondary | ICD-10-CM | POA: Diagnosis not present

## 2012-03-23 ENCOUNTER — Ambulatory Visit (INDEPENDENT_AMBULATORY_CARE_PROVIDER_SITE_OTHER): Payer: Medicare Other | Admitting: Internal Medicine

## 2012-03-23 ENCOUNTER — Telehealth: Payer: Self-pay | Admitting: Internal Medicine

## 2012-03-23 VITALS — BP 136/84 | HR 84 | Temp 98.2°F | Wt 223.0 lb

## 2012-03-23 DIAGNOSIS — M545 Low back pain, unspecified: Secondary | ICD-10-CM

## 2012-03-23 DIAGNOSIS — L0291 Cutaneous abscess, unspecified: Secondary | ICD-10-CM | POA: Diagnosis not present

## 2012-03-23 DIAGNOSIS — L0390 Cellulitis, unspecified: Secondary | ICD-10-CM

## 2012-03-23 MED ORDER — DOXYCYCLINE HYCLATE 100 MG PO TABS: 100.0000 mg | ORAL_TABLET | Freq: Two times a day (BID) | ORAL | Status: AC

## 2012-03-23 MED ORDER — TRAMADOL HCL 50 MG PO TABS: 50.0000 mg | ORAL_TABLET | Freq: Four times a day (QID) | ORAL | Status: AC | PRN

## 2012-03-23 NOTE — Patient Instructions (Signed)
Take antibiotics as prescribed, call if not improving in few days. Put some antibiotic ointment in the toe.

## 2012-03-23 NOTE — Progress Notes (Signed)
  Subjective:    Patient ID: Danny Kennedy, male    DOB: [birth year], 66 y.o.   MRN: [id number]  HPI Acute visit Was recently seen with back pain, Flexeril helps some but made him very sleepy. Tramadol does help. Would like a refill on, also a referral to physical therapy.  2 days ago, he had several ticks in his legs after he worked in the yard, he pulled one of the ticks  from the third right toe and area looks quite irritated. In the past he had RMSF and is concerned about it.  Past Medical History  Diagnosis Date  . Sleep apnea     CPAP  . Dupuytren contracture     RUE  . Hyperlipemia     LDL goal = < 100  . Gout   . Diverticulosis   . Nephrolithiasis 1478,2956    Dr Aldean Ast, X 2    Past Surgical History  Procedure Date  . Colonoscopy w/ polypectomy 2005, 2007    x2 negative 10-2007, Dr Jarold Motto   . Renal calculus     retirieved via basket 1984; stone passed spontaneously 2011; Otic tube R TM      Review of Systems No rash. No fever or chills. He had a mild headache today but other than that no headaches. He has some aches and pains but they are at baseline.    Objective:   Physical Exam  General -- alert, well-developed. No apparent distress.  Extremities-- no pretibial edema bilaterally , he has a 0.5x0.5 cm area of redness and excoriation proximal to the R 3th toenail, otherwise  feet exam is normal Neurologic-- alert & oriented X3  Psych-- Cognition and judgment appear intact. Alert and cooperative with normal attention span and concentration.  not anxious appearing and not depressed appearing.       Assessment & Plan:   Back pain,  not improving. Will refills trial which helped and discontinued Flexeril which makes him sleepy. Refer to physical therapy.  Small cellulitis at the toe, patient is also concerned due to the h/o RMSF Plan: Doxycycline for one week, see instructions

## 2012-03-23 NOTE — Telephone Encounter (Signed)
Pt is requesting a call back from Chrae, Dr. Frederik Pear CMA. He states he needs to see Dr. Alwyn Ren today. I explained that Dr. Alwyn Ren will not be able to see him today because he is full for the day. I offered him appt with Dr. Drue Novel for today and Dr. Alwyn Ren for tomorrow. Pt refused. He would like to speak with Chrae. Call back # [phone]

## 2012-03-23 NOTE — Telephone Encounter (Addendum)
I called and spoke with patient, patient states he is having back pain. Patient also had a tick bite on his toe and has a history of RMSF

## 2012-03-24 ENCOUNTER — Encounter: Payer: Self-pay | Admitting: Internal Medicine

## 2012-03-31 DIAGNOSIS — M7660 Achilles tendinitis, unspecified leg: Secondary | ICD-10-CM | POA: Diagnosis not present

## 2012-04-07 DIAGNOSIS — M5137 Other intervertebral disc degeneration, lumbosacral region: Secondary | ICD-10-CM | POA: Diagnosis not present

## 2012-04-09 DIAGNOSIS — M5137 Other intervertebral disc degeneration, lumbosacral region: Secondary | ICD-10-CM | POA: Diagnosis not present

## 2012-04-13 DIAGNOSIS — M5137 Other intervertebral disc degeneration, lumbosacral region: Secondary | ICD-10-CM | POA: Diagnosis not present

## 2012-04-15 DIAGNOSIS — M5137 Other intervertebral disc degeneration, lumbosacral region: Secondary | ICD-10-CM | POA: Diagnosis not present

## 2012-04-20 ENCOUNTER — Encounter: Payer: Self-pay | Admitting: Internal Medicine

## 2012-04-20 ENCOUNTER — Ambulatory Visit (INDEPENDENT_AMBULATORY_CARE_PROVIDER_SITE_OTHER): Payer: Medicare Other | Admitting: Internal Medicine

## 2012-04-20 VITALS — BP 146/90 | HR 118 | Temp 98.5°F | Wt 225.0 lb

## 2012-04-20 DIAGNOSIS — R42 Dizziness and giddiness: Secondary | ICD-10-CM | POA: Diagnosis not present

## 2012-04-20 DIAGNOSIS — I10 Essential (primary) hypertension: Secondary | ICD-10-CM | POA: Diagnosis not present

## 2012-04-20 NOTE — Patient Instructions (Addendum)
Check the  blood pressure 2 or 3 times a week, be sure it is between 110/60 and 140/85. If it is consistently higher or lower, let me know Please see Dr Alwyn Ren within 2 months

## 2012-04-20 NOTE — Assessment & Plan Note (Addendum)
2 episodes of dizziness, review of systems negative, symptom description fits more a peripheral issue. EKG today unchanged from previous Plan: CBC, BMP rest-fluids Call if sx resurface \\pt  very aware of stroke type of sx

## 2012-04-20 NOTE — Progress Notes (Signed)
  Subjective:    Patient ID: Danny Kennedy, male    DOB: 09/01/1946, 66 y.o.   MRN: [id number]  HPI Acute visit, chief complaint dizziness. Yesterday, he was doing his exercises, after 3 pushups he becomes dizzy, described as spinning, it was associated with nausea and sweats. His wife said that he looked "gray". Symptoms self subsided 20 minutes later, the rest of the day he was slightly dizzy. He check his BP last night and was 146/84 , today it was 145/78. BP is usually 135/72. 3 hours ago, he was at work, he flex his neck to read something and shortly after he got dizzy again. Symptoms are resolved. He recently was prescribed Flexeril for back pain, 2 days ago he took one by mistake and got a little sleepy.  Past Medical History  Diagnosis Date  . Sleep apnea     CPAP  . Dupuytren contracture     RUE  . Hyperlipemia     LDL goal = < 100  . Gout   . Diverticulosis   . Nephrolithiasis [id number]    Dr Aldean Ast, X 2   . Unspecified essential hypertension 05/17/2009    Qualifier: Diagnosis of  By: Alwyn Ren MD, Chrissie Noa     Past Surgical History  Procedure Date  . Colonoscopy w/ polypectomy 2005, 2007    x2 negative 10-2007, Dr Jarold Motto   . Renal calculus     retirieved via basket 1984; stone passed spontaneously 2011; Otic tube R TM     Review of Systems Denies chest pain or palpitations No slurred speech, no motor deficits. Had a mild headache last night but that is resolved. Not URI symptoms, no ear ache.     Objective:   Physical Exam General -- alert, well-developed . No apparent distress.  Neck --full range of motion, normal carotid pulses.  Lungs -- normal respiratory effort, no intercostal retractions, no accessory muscle use, and normal breath sounds.   Heart-- normal rate, regular rhythm, no murmur, and no gallop.   Extremities-- no pretibial edema bilaterally  Neurologic-- alert & oriented X3, speech, gait and motor are intact. DTRs symmetric, slightly  decreased through out (patient asked me not to do a  left ankle jerk due to tendinitis)  Psych-- Cognition and judgment appear intact. Alert and cooperative with normal attention span and concentration.  not anxious appearing and not depressed appearing.       Assessment & Plan:

## 2012-04-20 NOTE — Assessment & Plan Note (Addendum)
Ambulatory BP is usually 135/72, it has been elevated elevated in the last 24 hours, see history of present illness. I'm recommending him to monitor his BP, see instructions

## 2012-04-21 ENCOUNTER — Encounter: Payer: Self-pay | Admitting: Internal Medicine

## 2012-04-21 LAB — BASIC METABOLIC PANEL WITH GFR
BUN: 21 mg/dL (ref 6–23)
CO2: 28 meq/L (ref 19–32)
Calcium: 9 mg/dL (ref 8.4–10.5)
Chloride: 105 meq/L (ref 96–112)
Creatinine, Ser: 1.2 mg/dL (ref 0.4–1.5)
GFR: 65.68 mL/min (ref >60.00)
Glucose, Bld: 134 mg/dL — ABNORMAL HIGH (ref 70–99)
Potassium: 4.1 meq/L (ref 3.5–5.1)
Sodium: 141 meq/L (ref 135–145)

## 2012-04-21 LAB — CBC WITH DIFFERENTIAL/PLATELET
Basophils Relative: 0.4 % (ref 0.0–3.0)
Eosinophils Relative: 2.7 % (ref 0.0–5.0)
Lymphocytes Relative: 29.1 % (ref 12.0–46.0)
Neutrophils Relative %: 63.8 % (ref 43.0–77.0)
RBC: 4.64 Mil/uL (ref 4.22–5.81)
WBC: 6.7 10*3/uL (ref 4.5–10.5)

## 2012-04-22 ENCOUNTER — Encounter: Payer: Self-pay | Admitting: *Deleted

## 2012-05-24 ENCOUNTER — Telehealth: Payer: Self-pay | Admitting: Internal Medicine

## 2012-05-24 NOTE — Telephone Encounter (Signed)
Opened in error. BC °

## 2012-09-01 DIAGNOSIS — L253 Unspecified contact dermatitis due to other chemical products: Secondary | ICD-10-CM | POA: Diagnosis not present

## 2012-09-01 DIAGNOSIS — B079 Viral wart, unspecified: Secondary | ICD-10-CM | POA: Diagnosis not present

## 2012-09-01 DIAGNOSIS — D234 Other benign neoplasm of skin of scalp and neck: Secondary | ICD-10-CM | POA: Diagnosis not present

## 2012-09-01 DIAGNOSIS — L570 Actinic keratosis: Secondary | ICD-10-CM | POA: Diagnosis not present

## 2012-09-13 ENCOUNTER — Telehealth: Payer: Self-pay | Admitting: Internal Medicine

## 2012-09-13 NOTE — Telephone Encounter (Signed)
Patient states he is expecting a grandchild and wants to get a tdap. He is not sure if he has had one or not. He would also like to get the shingles vaccine and is checking with insurance.

## 2012-09-13 NOTE — Telephone Encounter (Signed)
Spoke with patient, patient aware TDaP (referred to as pertussis vaccine) may not be covered by insurance. Patient states he has to have it either way in order to have contact with his grand-baby,scheduled appointment for Wed 09/15/12 @9 :45.  Patient will anticipate getting shingles vaccine in Jan 2014 at [facility] appointment, patient will check with insurance company prior

## 2012-09-15 ENCOUNTER — Ambulatory Visit (INDEPENDENT_AMBULATORY_CARE_PROVIDER_SITE_OTHER): Payer: Medicare Other

## 2012-09-15 DIAGNOSIS — Z299 Encounter for prophylactic measures, unspecified: Secondary | ICD-10-CM | POA: Diagnosis not present

## 2012-09-15 DIAGNOSIS — Z23 Encounter for immunization: Secondary | ICD-10-CM

## 2012-09-21 ENCOUNTER — Other Ambulatory Visit: Payer: Self-pay | Admitting: Internal Medicine

## 2012-09-21 NOTE — Telephone Encounter (Signed)
Rx sent.    MW 

## 2012-10-11 ENCOUNTER — Ambulatory Visit (INDEPENDENT_AMBULATORY_CARE_PROVIDER_SITE_OTHER): Payer: Medicare Other | Admitting: Family Medicine

## 2012-10-11 ENCOUNTER — Telehealth: Payer: Self-pay | Admitting: Internal Medicine

## 2012-10-11 ENCOUNTER — Encounter: Payer: Self-pay | Admitting: Family Medicine

## 2012-10-11 VITALS — BP 110/60 | HR 96 | Temp 98.2°F | Ht 70.25 in | Wt 231.6 lb

## 2012-10-11 DIAGNOSIS — J329 Chronic sinusitis, unspecified: Secondary | ICD-10-CM | POA: Diagnosis not present

## 2012-10-11 MED ORDER — AZITHROMYCIN 250 MG PO TABS: ORAL_TABLET | ORAL | Status: DC

## 2012-10-11 NOTE — Telephone Encounter (Signed)
Spoke with patient, patient with sinus infection. Patient scheduled to see Tabori today at 3:30

## 2012-10-11 NOTE — Assessment & Plan Note (Signed)
New to provider- likely early infxn w/ probable bronchitis.  Start Zpack as pt reports this has worked well in the past and has [employer], [employer].  Reviewed supportive care and red flags that should prompt return.  Pt expressed understanding and is in agreement w/ plan.

## 2012-10-11 NOTE — Patient Instructions (Addendum)
This appears to be an early sinus infection Start the Zpack as directed Add Mucinex to thin your congestion Drink plenty of fluids REST! Enjoy your trip!!!

## 2012-10-11 NOTE — Telephone Encounter (Signed)
Patient calling requesting a zpak. Pt uses [facility] on [address]

## 2012-10-11 NOTE — Progress Notes (Signed)
  Subjective:    Patient ID: Danny Kennedy, male    DOB: 02/26/1946, 66 y.o.   MRN: [id number]  HPI URI- pt reports 'cold sxs' sxs Friday.  Now coughing up brown sputum, brown nasal congestion.  + HA.  + facial pressure.  No ear pain.  No fevers.  + sick contacts.  No N/V/D.  In past has done well w/ Zpack.   Review of Systems For ROS see HPI     Objective:   Physical Exam  Vitals reviewed. Constitutional: He appears well-developed and well-nourished. No distress.  HENT:  Head: Normocephalic and atraumatic.  Right Ear: Tympanic membrane normal.  Left Ear: Tympanic membrane normal.  Nose: No mucosal edema or rhinorrhea. Right sinus exhibits no maxillary sinus tenderness and no frontal sinus tenderness. Left sinus exhibits no maxillary sinus tenderness and no frontal sinus tenderness.  Mouth/Throat: Mucous membranes are normal. No oropharyngeal exudate, posterior oropharyngeal edema or posterior oropharyngeal erythema.  Eyes: Conjunctivae normal and EOM are normal. Pupils are equal, round, and reactive to light.  Neck: Normal range of motion. Neck supple.  Cardiovascular: Normal rate, regular rhythm and normal heart sounds.   Pulmonary/Chest: Effort normal and breath sounds normal. No respiratory distress. He has no wheezes.       + hacking cough  Lymphadenopathy:    He has no cervical adenopathy.  Skin: Skin is warm and dry.          Assessment & Plan:

## 2012-10-25 DIAGNOSIS — H2510 Age-related nuclear cataract, unspecified eye: Secondary | ICD-10-CM | POA: Diagnosis not present

## 2012-10-25 DIAGNOSIS — H52209 Unspecified astigmatism, unspecified eye: Secondary | ICD-10-CM | POA: Diagnosis not present

## 2012-10-25 DIAGNOSIS — H5200 Hypermetropia, unspecified eye: Secondary | ICD-10-CM | POA: Diagnosis not present

## 2012-10-25 DIAGNOSIS — H524 Presbyopia: Secondary | ICD-10-CM | POA: Diagnosis not present

## 2012-11-11 ENCOUNTER — Encounter: Payer: Self-pay | Admitting: Gastroenterology

## 2012-11-15 ENCOUNTER — Encounter: Payer: Self-pay | Admitting: Gastroenterology

## 2012-11-15 ENCOUNTER — Other Ambulatory Visit: Payer: Self-pay | Admitting: Internal Medicine

## 2012-11-29 ENCOUNTER — Ambulatory Visit (AMBULATORY_SURGERY_CENTER): Payer: Medicare Other | Admitting: *Deleted

## 2012-11-29 VITALS — Ht 71.0 in | Wt 234.0 lb

## 2012-11-29 DIAGNOSIS — Z1211 Encounter for screening for malignant neoplasm of colon: Secondary | ICD-10-CM

## 2012-11-29 MED ORDER — MOVIPREP 100 G PO SOLR: ORAL | Status: DC

## 2012-12-03 ENCOUNTER — Ambulatory Visit (INDEPENDENT_AMBULATORY_CARE_PROVIDER_SITE_OTHER): Payer: Medicare Other | Admitting: Internal Medicine

## 2012-12-03 ENCOUNTER — Encounter: Payer: Self-pay | Admitting: Internal Medicine

## 2012-12-03 VITALS — BP 130/76 | HR 94 | Temp 98.1°F | Resp 14 | Ht 71.03 in | Wt 236.0 lb

## 2012-12-03 DIAGNOSIS — Z0000 Encounter for general adult medical examination without abnormal findings: Secondary | ICD-10-CM | POA: Diagnosis not present

## 2012-12-03 DIAGNOSIS — Z87442 Personal history of urinary calculi: Secondary | ICD-10-CM | POA: Diagnosis not present

## 2012-12-03 DIAGNOSIS — M109 Gout, unspecified: Secondary | ICD-10-CM

## 2012-12-03 DIAGNOSIS — E785 Hyperlipidemia, unspecified: Secondary | ICD-10-CM | POA: Diagnosis not present

## 2012-12-03 DIAGNOSIS — I10 Essential (primary) hypertension: Secondary | ICD-10-CM | POA: Diagnosis not present

## 2012-12-03 DIAGNOSIS — R7309 Other abnormal glucose: Secondary | ICD-10-CM | POA: Diagnosis not present

## 2012-12-03 MED ORDER — ZOSTER VACCINE LIVE 19400 UNT/0.65ML ~~LOC~~ SOLR: 0.6500 mL | Freq: Once | SUBCUTANEOUS | Status: DC

## 2012-12-03 NOTE — Progress Notes (Signed)
Subjective:    Patient ID: Danny Kennedy, male    DOB: 08-Jan-1946, 67 y.o.   MRN: [id number]  HPI Medicare Wellness Visit:  Psychosocial & medical history were reviewed as required by Medicare (abuse,antisocial behavioral risks,firearm risk).  Social history: caffeine:minimal  , alcohol:2 beers/ month   ,  tobacco use: quit 1992 Exercise : walking 3.5 mpd 3-4 X/ week  No home & personal  safety / fall risk Activities of daily living: no limitations  Seatbelt  and smoke alarm employed. Power of Attorney/Living Will status : in place Ophthalmology exam current Hearing evaluation  current Orientation :oriented X 3  Memory & recall :good Spelling  testing:good Mood & affect : normal . Depression / anxiety: denied Travel history : last 2012 [place] Immunization status :Shingles  needed Transfusion history:  none  Preventive health surveillance ( colonoscopy as per protocol/ [facility]):  colonoscopy pending Dental care:  Every 6 mos. Chart reviewed &  Updated. Active issues reviewed & addressed.       Review of Systems HYPERTENSION: Disease Monitoring: Blood pressure  not monitored Compliant with present antihypertensive regimen   PMH of FASTING HYPERGLYCEMIA : Disease Monitoring: Blood Sugar: no monitor  Medication Compliance: no diabetic meds Diet: reduced carbs   HYPERLIPIDEMIA: Diet & exercise as noted Medication Compliance:  Statin taken  No FH premature CAD / CVA       Signs & symptoms not present or negative as noted below: No significant headaches No chest pain, palpitations , claudications, PNDyspnea    No exertional dyspnea No lightheadedness or syncope   No edema No polyuria/phagia/dipsia No limb numbness & tingling or weakness No non healing skin lesions      No blurred , double or loss of vision No abd pain, bowel changes No significant myalgias                 Objective:   Physical Exam Gen.: Healthy and well-nourished in appearance.  Alert, appropriate and cooperative throughout exam. Appears younger than stated age  Head: Normocephalic without obvious abnormalities  Eyes: No corneal or conjunctival inflammation noted. Pupils equal round reactive to light and accommodation. Vision grossly normal. Ears: External  ear exam reveals no significant lesions or deformities. Canals clear .R TM scarred(post otic tube). Hearing is grossly normal bilaterally. Nose: External nasal exam reveals no deformity or inflammation. Nasal mucosa are pink and moist. No lesions or exudates noted.   Mouth: Oral mucosa and oropharynx reveal no lesions or exudates. Teeth in good repair. Neck: No deformities, masses, or tenderness noted. Range of motion & Thyroid normal. Lungs: Normal respiratory effort; chest expands symmetrically. Lungs are clear to auscultation without rales, wheezes, or increased work of breathing. Heart: Normal rate and rhythm. Normal S1 and S2. No gallop, click, or rub. S4 w/o murmur. Abdomen: Bowel sounds normal; abdomen soft and nontender. No masses, organomegaly or hernias noted. Genitalia: deferred ; colonoscopy pending. PSA 0.17 in 2010.                                    Musculoskeletal/extremities: No deformity or scoliosis noted of  the thoracic or lumbar spine. No clubbing, cyanosis, edema, or significant extremity  deformity noted. Range of motion normal .Tone & strength  normal.Joints ; minor DIP changes.Dupuytren's contractures base R thumb & 4th finger. Nail health good. Able to lie down & sit up w/o help. Negative SLR bilaterally Vascular: Carotid,  radial artery, dorsalis pedis and  posterior tibial pulses are full and equal. No bruits present. Neurologic: Alert and oriented x3. Deep tendon reflexes symmetrical and normal.  Skin: Intact without suspicious lesions or rashes. Lymph: No cervical, axillary lymphadenopathy present. Psych: Mood and affect are normal. Normally interactive                                                                                         Assessment & Plan:  #1 comprehensive physical exam; no acute findings  Plan: see Orders  & Recommendations

## 2012-12-03 NOTE — Addendum Note (Signed)
Addended by: Maurice Small on: 12/03/2012 11:26 AM   Modules accepted: Orders

## 2012-12-03 NOTE — Patient Instructions (Addendum)
Preventive Health Care: Exercise at least 30-45 minutes a day,  3-4 days a week.  Eat a low-fat diet with lots of fruits and vegetables, up to 7-9 servings per day. Consume less than 40 grams of sugar per day from foods & drinks with High Fructose Corn Sugar as #1,2,3 or # 4 on label. Please  schedule fasting Labs : Lipids, hepatic panel, TSH,A1c, uric acid. PLEASE BRING THESE INSTRUCTIONS TO FOLLOW UP  LAB APPOINTMENT.This will guarantee correct labs are drawn, eliminating need for repeat blood sampling ( needle sticks ! ). Diagnoses /Codes: 790.29,401.9,274.9  If you activate My Chart; the results can be released to you as soon as they populate from the lab. If you choose not to use this program; the labs have to be reviewed, copied & mailed   causing a delay in getting the results to you.

## 2012-12-13 ENCOUNTER — Encounter: Payer: Self-pay | Admitting: Gastroenterology

## 2012-12-13 ENCOUNTER — Ambulatory Visit (AMBULATORY_SURGERY_CENTER): Payer: Medicare Other | Admitting: Gastroenterology

## 2012-12-13 VITALS — BP 123/70 | HR 72 | Temp 98.4°F | Resp 15 | Ht 71.0 in | Wt 234.0 lb

## 2012-12-13 DIAGNOSIS — Z1211 Encounter for screening for malignant neoplasm of colon: Secondary | ICD-10-CM

## 2012-12-13 DIAGNOSIS — I10 Essential (primary) hypertension: Secondary | ICD-10-CM | POA: Diagnosis not present

## 2012-12-13 DIAGNOSIS — D126 Benign neoplasm of colon, unspecified: Secondary | ICD-10-CM

## 2012-12-13 DIAGNOSIS — G4733 Obstructive sleep apnea (adult) (pediatric): Secondary | ICD-10-CM | POA: Diagnosis not present

## 2012-12-13 DIAGNOSIS — K5730 Diverticulosis of large intestine without perforation or abscess without bleeding: Secondary | ICD-10-CM

## 2012-12-13 MED ORDER — SODIUM CHLORIDE 0.9 % IV SOLN: 500.0000 mL | INTRAVENOUS | Status: DC

## 2012-12-13 NOTE — Patient Instructions (Addendum)
YOU HAD AN ENDOSCOPIC PROCEDURE TODAY AT THE [facility]: Refer to the procedure report that was given to you for any specific questions about what was found during the examination.  If the procedure report does not answer your questions, please call your gastroenterologist to clarify.  If you requested that your care partner not be given the details of your procedure findings, then the procedure report has been included in a sealed envelope for you to review at your convenience later.  YOU SHOULD EXPECT: Some feelings of bloating in the abdomen. Passage of more gas than usual.  Walking can help get rid of the air that was put into your GI tract during the procedure and reduce the bloating. If you had a lower endoscopy (such as a colonoscopy or flexible sigmoidoscopy) you may notice spotting of blood in your stool or on the toilet paper. If you underwent a bowel prep for your procedure, then you may not have a normal bowel movement for a few days.  DIET: Your first meal following the procedure should be a light meal and then it is ok to progress to your normal diet.  A half-sandwich or bowl of soup is an example of a good first meal.  Heavy or fried foods are harder to digest and may make you feel nauseous or bloated.  Likewise meals heavy in dairy and vegetables can cause extra gas to form and this can also increase the bloating.  Drink plenty of fluids but you should avoid alcoholic beverages for 24 hours.  Try to increase the fiber in your diet to prevent diverticulitis.  ACTIVITY: Your care partner should take you home directly after the procedure.  You should plan to take it easy, moving slowly for the rest of the day.  You can resume normal activity the day after the procedure however you should NOT DRIVE or use heavy machinery for 24 hours (because of the sedation medicines used during the test).    SYMPTOMS TO REPORT IMMEDIATELY: A gastroenterologist can be reached at any hour.  During  normal business hours, 8:30 AM to 5:00 PM Monday through Friday, call [phone].  After hours and on weekends, please call the GI answering service at [phone] who will take a message and have the physician on call contact you.   Following lower endoscopy (colonoscopy or flexible sigmoidoscopy):  Excessive amounts of blood in the stool  Significant tenderness or worsening of abdominal pains  Swelling of the abdomen that is new, acute  Fever of 100F or higher  FOLLOW UP: If any biopsies were taken you will be contacted by phone or by letter within the next 1-3 weeks.  Call your gastroenterologist if you have not heard about the biopsies in 3 weeks.  Our staff will call the home number listed on your records the next business day following your procedure to check on you and address any questions or concerns that you may have at that time regarding the information given to you following your procedure. This is a courtesy call and so if there is no answer at the home number and we have not heard from you through the emergency physician on call, we will assume that you have returned to your regular daily activities without incident.  SIGNATURES/CONFIDENTIALITY: You and/or your care partner have signed paperwork which will be entered into your electronic medical record.  These signatures attest to the fact that that the information above on your After Visit Summary has been  reviewed and is understood.  Full responsibility of the confidentiality of this discharge information lies with you and/or your care-partner. 

## 2012-12-13 NOTE — Progress Notes (Signed)
Called to room to assist during endoscopic procedure.  Patient ID and intended procedure confirmed with present staff. Received instructions for my participation in the procedure from the performing physician.  

## 2012-12-13 NOTE — Op Note (Signed)
[facility] [address] [place], [address]   COLONOSCOPY PROCEDURE REPORT  PATIENT: Danny Kennedy, Danny Kennedy  MR#: [id number] BIRTHDATE: May 28, 1946 , 66  yrs. old GENDER: Male ENDOSCOPIST: Mardella Layman, MD, [facility] REFERRED BY: PROCEDURE DATE:  12/13/2012 PROCEDURE:   Colonoscopy with snare polypectomy ASA CLASS:   Class II INDICATIONS:Patient's personal history of colon polyps. MEDICATIONS: Propofol (Diprivan) 280 mg IV  DESCRIPTION OF PROCEDURE:   After the risks and benefits and of the procedure were explained, informed consent was obtained.  A digital rectal exam revealed no abnormalities of the rectum.    The LB CF-[id number]  endoscope was introduced through the anus and advanced to the cecum, which was identified by both the appendix and ileocecal valve .  The quality of the prep was excellent, using MoviPrep .  The instrument was then slowly withdrawn as the colon was fully examined.     COLON FINDINGS: There was marked diverticulosis in the left colon. Colonoscope was advanced to the base of the cecum with some difficulty because of a very redundant hepatic flexure.  On the inferior wall day; there was a 1.5 cm polypoid flat polyp that was removed with a hot snare.  This was retrieved and sent to pathology for exam.colonoscope was then slowly withdrawn throughout the length of the colon which otherwise was unremarkable except for 2 flat left colon polyps that were also removed and placed in specimen jar #2.  Retroflexed view of the rectum is unremarkable. s.     The scope was then withdrawn from the patient and the procedure completed.  COMPLICATIONS: There were no complications. ENDOSCOPIC IMPRESSION: 1.   There was marked diverticulosis in the left colon.  Colonoscope was advanced to the base of the cecum with some difficulty because of a very redundant hepatic flexure.  On the inferior wall day; there was a 1.5 cm polypoid flat polyp that was  removed with a hot snare.  This was retrieved and sent to pathology for exam.colonoscope was then slowly withdrawn throughout the length of the colon which otherwise was unremarkable except for 2 flat left colon polyps that were also removed and placed in specimen jar #2. Retroflexed view of the rectum is unremarkable. removed and placed in specimen jar #2. 2.this patient has a long history recurrent polyps at high risk for future polyps and colon cancer.  Also has rather severe diverticulosis. RECOMMENDATIONS: 1.  Await pathology results 2.  Repeat Colonoscopy in 3 years. 3.  High fiber diet with liberal fluid intake.   REPEAT EXAM:  cc:Pecola Lawless, MD  _______________________________ eSigned:  Mardella Layman, MD, [facility] 12/13/2012 9:33 AM     PATIENT NAME:  Danny Kennedy, Danny Kennedy MR#: [id number]

## 2012-12-13 NOTE — Progress Notes (Addendum)
Patient did not have preoperative order for IV antibiotic SSI prophylaxis. (G8918)  Patient did not experience any of the following events: a burn prior to discharge; a fall within the facility; wrong site/side/patient/procedure/implant event; or a hospital transfer or hospital admission upon discharge from the facility. (G8907)  

## 2012-12-14 ENCOUNTER — Telehealth: Payer: Self-pay | Admitting: *Deleted

## 2012-12-14 NOTE — Telephone Encounter (Signed)
  Follow up Call-  Call back number 12/13/2012  Post procedure Call Back phone  # [phone]  Permission to leave phone message Yes     Patient questions:  Do you have a fever, pain , or abdominal swelling? no Pain Score  0 *  Have you tolerated food without any problems? yes  Have you been able to return to your normal activities? yes  Do you have any questions about your discharge instructions: Diet   no Medications  no Follow up visit  no  Do you have questions or concerns about your Care? no  Actions: * If pain score is 4 or above: No action needed, pain <4.

## 2012-12-20 ENCOUNTER — Other Ambulatory Visit: Payer: Self-pay | Admitting: Internal Medicine

## 2012-12-27 ENCOUNTER — Encounter: Payer: Self-pay | Admitting: Gastroenterology

## 2013-01-21 ENCOUNTER — Other Ambulatory Visit: Payer: Self-pay | Admitting: Internal Medicine

## 2013-02-21 ENCOUNTER — Other Ambulatory Visit (INDEPENDENT_AMBULATORY_CARE_PROVIDER_SITE_OTHER): Payer: Medicare Other

## 2013-02-21 DIAGNOSIS — E785 Hyperlipidemia, unspecified: Secondary | ICD-10-CM | POA: Diagnosis not present

## 2013-02-21 DIAGNOSIS — I10 Essential (primary) hypertension: Secondary | ICD-10-CM | POA: Diagnosis not present

## 2013-02-21 DIAGNOSIS — R7309 Other abnormal glucose: Secondary | ICD-10-CM | POA: Diagnosis not present

## 2013-02-21 DIAGNOSIS — M109 Gout, unspecified: Secondary | ICD-10-CM

## 2013-02-21 DIAGNOSIS — Z0000 Encounter for general adult medical examination without abnormal findings: Secondary | ICD-10-CM

## 2013-02-21 LAB — HEPATIC FUNCTION PANEL
AST: 21 U/L (ref 0–37)
Albumin: 4.1 g/dL (ref 3.5–5.2)
Alkaline Phosphatase: 45 U/L (ref 39–117)
Total Protein: 7 g/dL (ref 6.0–8.3)

## 2013-02-21 LAB — LIPID PANEL
Cholesterol: 129 mg/dL (ref 0–200)
Triglycerides: 93 mg/dL (ref 0.0–149.0)

## 2013-02-23 ENCOUNTER — Other Ambulatory Visit: Payer: Self-pay | Admitting: Internal Medicine

## 2013-03-09 ENCOUNTER — Ambulatory Visit (INDEPENDENT_AMBULATORY_CARE_PROVIDER_SITE_OTHER): Payer: Medicare Other | Admitting: Internal Medicine

## 2013-03-09 VITALS — BP 136/82 | HR 112 | Temp 99.0°F | Wt 230.0 lb

## 2013-03-09 DIAGNOSIS — J069 Acute upper respiratory infection, unspecified: Secondary | ICD-10-CM

## 2013-03-09 MED ORDER — AMOXICILLIN 500 MG PO CAPS: 1000.0000 mg | ORAL_CAPSULE | Freq: Two times a day (BID) | ORAL | Status: DC

## 2013-03-09 MED ORDER — AZELASTINE-FLUTICASONE 137-50 MCG/ACT NA SUSP: 2.0000 | Freq: Every evening | NASAL | Status: DC | PRN

## 2013-03-09 NOTE — Patient Instructions (Signed)
Rest, fluids , tylenol For cough, take Mucinex DM twice a day as needed  For congestion use Dymista 2 sprays on each side of the nose at night until you feel better (may cause drowsiness) Ok to take a small dose of sudafed for only 2-3 days  Take the antibiotic as prescribed  (Amoxicillin) if no better in 2-3 days  Call if no better in few days Call anytime if the symptoms are severe, you have high fever

## 2013-03-09 NOTE — Progress Notes (Signed)
  Subjective:    Patient ID: Danny Kennedy, male    DOB: 05/16/1946, 67 y.o.   MRN: [id number]  HPI Acute visit. Symptoms started yesterday with nose congestion, sore throat, low-grade temperature, cough. Also some frontal headaches. He is only taking Zicam OTC plus Sudafed for which he had to sign at the pharmacy. He is concerned because he is leaving to [place] in few days.  Past Medical History  Diagnosis Date  . Sleep apnea     CPAP @ 11cm  . Dupuytren contracture     RUE @ thumb  . Hyperlipemia     LDL goal = < 100  . Gout   . Diverticulosis   . Nephrolithiasis 3086,5784    Dr Aldean Ast, X 2   . Unspecified essential hypertension 05/17/2009   Past Surgical History  Procedure Laterality Date  . Colonoscopy w/ polypectomy  2005, 2007    x2 negative 10-2007, Dr Jarold Motto   . Renal calculus      retirieved via basket 1984; stone passed spontaneously 2011; Otic tube R TM  . Wisdom tooth extraction      Review of Systems No nausea, vomiting, diarrhea. Mild myalgias. + Clear nasal discharge. No shortness of breath or wheezing    Objective:   Physical Exam BP 136/82  Pulse 112  Temp(Src) 99 F (37.2 C) (Oral)  Wt 230 lb (104.327 kg)  BMI 32.09 kg/m2  SpO2 95%  General -- alert, well-developed, Nontoxic appearing, slightly tachycardic (?due to Sudafed)  HEENT -- TMs normal, throat w/o redness, face symmetric and not tender to palpation, nose definitely congested, and no discharge noted. Lungs -- normal respiratory effort, no intercostal retractions, no accessory muscle use, and normal breath sounds.   Heart-- normal rate, regular rhythm, no murmur, and no gallop.    Neurologic-- alert & oriented X3 and strength normal in all extremities. Psych-- Cognition and judgment appear intact. Alert and cooperative with normal attention span and concentration.  not anxious appearing and not depressed appearing.      Assessment & Plan:  URI, Symptoms consistent with URI, he  is a slightly tachycardic, likely due to to Sudafed. Will start conservative treatment, a sample and a prescription provided for Dymista which can be used safely long-term if needed. See Instructions. If not better will start amoxicillin.

## 2013-03-10 ENCOUNTER — Encounter: Payer: Self-pay | Admitting: Internal Medicine

## 2013-03-29 ENCOUNTER — Other Ambulatory Visit: Payer: Self-pay | Admitting: Internal Medicine

## 2013-03-30 DIAGNOSIS — M720 Palmar fascial fibromatosis [Dupuytren]: Secondary | ICD-10-CM | POA: Diagnosis not present

## 2013-04-11 ENCOUNTER — Other Ambulatory Visit: Payer: Self-pay | Admitting: Dermatology

## 2013-04-11 DIAGNOSIS — D485 Neoplasm of uncertain behavior of skin: Secondary | ICD-10-CM | POA: Diagnosis not present

## 2013-04-11 DIAGNOSIS — C44221 Squamous cell carcinoma of skin of unspecified ear and external auricular canal: Secondary | ICD-10-CM | POA: Diagnosis not present

## 2013-04-15 ENCOUNTER — Encounter: Payer: Self-pay | Admitting: Internal Medicine

## 2013-04-15 DIAGNOSIS — C44221 Squamous cell carcinoma of skin of unspecified ear and external auricular canal: Secondary | ICD-10-CM | POA: Insufficient documentation

## 2013-08-21 ENCOUNTER — Encounter: Payer: Self-pay | Admitting: Internal Medicine

## 2013-09-08 ENCOUNTER — Telehealth: Payer: Self-pay | Admitting: Internal Medicine

## 2013-09-08 NOTE — Telephone Encounter (Signed)
Please advise 

## 2013-09-08 NOTE — Telephone Encounter (Signed)
Called and spoke with patient and he is going to contact Dr. Clovis Pu office for new order

## 2013-09-08 NOTE — Telephone Encounter (Signed)
Dr Annalee Genta had ordered this ; documentation of need & response  requested by equipment company. If not seeing Dr Perrin [facility] should be consulted

## 2013-09-08 NOTE — Telephone Encounter (Signed)
Patient states he needs a new cpap machine and would like Dr. Alwyn Ren to sign off on that without him coming in for an appointment.

## 2013-10-24 ENCOUNTER — Telehealth: Payer: Self-pay | Admitting: Internal Medicine

## 2013-10-24 NOTE — Telephone Encounter (Signed)
Patient is calling requesting an appointment for today. He has cold symptoms. Advised that Dr. Alwyn Ren has no availability today. Offered appointment at another office with available provider, pt declined and stated "tell Dr. Alwyn Ren that I need to be seen and he will work me in." Please advise.

## 2013-10-25 NOTE — Telephone Encounter (Signed)
Lm @ (9:06am) asking the pt to RTC regarding note below.//AB/CMA 

## 2013-10-26 ENCOUNTER — Encounter: Payer: Self-pay | Admitting: Internal Medicine

## 2013-10-26 ENCOUNTER — Ambulatory Visit (INDEPENDENT_AMBULATORY_CARE_PROVIDER_SITE_OTHER): Payer: Medicare Other | Admitting: Internal Medicine

## 2013-10-26 VITALS — BP 117/67 | HR 90 | Temp 98.5°F | Wt 238.4 lb

## 2013-10-26 DIAGNOSIS — IMO0002 Reserved for concepts with insufficient information to code with codable children: Secondary | ICD-10-CM

## 2013-10-26 DIAGNOSIS — G4730 Sleep apnea, unspecified: Secondary | ICD-10-CM | POA: Diagnosis not present

## 2013-10-26 DIAGNOSIS — M5416 Radiculopathy, lumbar region: Secondary | ICD-10-CM

## 2013-10-26 DIAGNOSIS — J310 Chronic rhinitis: Secondary | ICD-10-CM | POA: Diagnosis not present

## 2013-10-26 NOTE — Progress Notes (Signed)
Subjective:    Patient ID: Danny Kennedy, male    DOB: 07/07/1946, 67 y.o.   MRN: [id number]  HPI   The symptoms began 2 weeks ago as a dry cough. This is in the context of postnasal drainage. He awakens for with head congestion.  The cough is aggravating the chronic right low back syndrome which is chronic. He does describe weakness in his legs after walking 50 yards at times.  He quit smoking in 1992. He is on benazepril but the cough has been an  issue for 2 weeks.  His father had lung cancer      Review of Systems   He specifically denies extrinsic symptoms of itchy, watery eyes, sneezing. The present illness is not associated with fever, chills, or sweats  He has no frontal sinus pain, facial pain, or nasal purulence.  He is not having hemoptysis, pleuritic chest pain, shortness of breath, or wheezing with the cough.  Reflexes not active issue at this time.  He denies numbness or  tingling in the legs. He also denies incontinence of urine or stool. Take it radiates from the right flank into the left mid posterior thigh.      Objective:   Physical Exam  Gen.: well-nourished in appearance. Alert, appropriate and cooperative throughout exam.  Head: Normocephalic without obvious abnormalities Eyes: No corneal or conjunctival inflammation noted. Pupils equal round reactive to light and accommodation. Extraocular motion intact.  Ears: External  ear exam reveals no significant lesions or deformities. Canals clear .TMs normal. Hearing is grossly normal bilaterally. Nose: External nasal exam reveals no deformity or inflammation. Nasal mucosa are pink and moist. No lesions or exudates noted. Septum slightly to R Mouth: Oral mucosa and oropharynx reveal no lesions or exudates. Teeth in good repair. Neck: No deformities, masses, or tenderness noted. Range of motion slightly decreased. Lungs: Normal respiratory effort; chest expands symmetrically. Lungs are clear to  auscultation without rales, wheezes, or increased work of breathing. Heart: Normal rate and rhythm. Normal S1 and S2. No gallop, click, or rub. S4 w/o murmur. Abdomen: Bowel sounds normal; abdomen soft and nontender. No masses, organomegaly or hernias noted.                                   Musculoskeletal/extremities: No deformity or scoliosis noted of  the thoracic or lumbar spine.   No clubbing, cyanosis, edema, or significant extremity  deformity noted. Range of motion normal .Tone & strength normal. Hand joints reveal minor DIP changes. Dupuytren's R palm. Fingernail l health good. Able to lie down & sit up w/o help. Negative SLR bilaterally Vascular: Carotid, radial artery, femoral ,dorsalis pedis and  posterior tibial pulses are  equal. Slightly decreased DPP.No bruits present. Neurologic: Alert and oriented x3. Deep tendon reflexes symmetrical and normal.  Gait normal  including heel & toe walking . Rhomberg & finger to nose       Skin: Intact without suspicious lesions or rashes. Lymph: No cervical, axillary lymphadenopathy present. Psych: Mood and affect are normal. Normally interactive  Assessment & Plan:   #1 non allergic rhinitis #2  lumbosacral pain syndrome. His symptoms suggest possible neurogenic claudication.  See orders

## 2013-10-26 NOTE — Patient Instructions (Signed)
Plain Mucinex (NOT D) for thick secretions ;force NON dairy fluids .   Nasal cleansing in the shower as discussed with lather of mild shampoo.After 10 seconds wash off lather while  exhaling through nostrils. Make sure that all residual soap is removed to prevent irritation.  Nasacort AQ OTC 1 spray in each nostril twice a day as needed. Use the "crossover" technique into opposite nostril spraying toward opposite ear @ 45 degree angle, not straight up into nostril.  Use a Neti pot daily only  as needed for significant sinus congestion; going from open side to congested side . Plain Allegra (NOT D )  160 daily , Loratidine 10 mg , OR Zyrtec 10 mg @ bedtime  as needed for itchy eyes & sneezing.   If the cough failed to respond to these maneuvers; we will need to change the benazepril to an angiotensin receptor blocker such as losartan.     I recommend a non operative Back Specialist consultation to determine optimal therapy for your back; please inform me if you have a physician preference. The best exercises for the low back include freestyle swimming, stretch aerobics, and yoga.

## 2013-10-26 NOTE — Telephone Encounter (Signed)
Pt was seen today in the office by Dr. Hopper.//AB/CMA 

## 2013-10-26 NOTE — Progress Notes (Signed)
Pre visit review using our clinic review tool, if applicable. No additional management support is needed unless otherwise documented below in the visit note. 

## 2013-11-28 DIAGNOSIS — I10 Essential (primary) hypertension: Secondary | ICD-10-CM | POA: Diagnosis not present

## 2013-11-28 DIAGNOSIS — Z6833 Body mass index (BMI) 33.0-33.9, adult: Secondary | ICD-10-CM | POA: Diagnosis not present

## 2013-11-28 DIAGNOSIS — G4733 Obstructive sleep apnea (adult) (pediatric): Secondary | ICD-10-CM | POA: Diagnosis not present

## 2013-11-28 DIAGNOSIS — M720 Palmar fascial fibromatosis [Dupuytren]: Secondary | ICD-10-CM | POA: Diagnosis not present

## 2013-11-28 DIAGNOSIS — E669 Obesity, unspecified: Secondary | ICD-10-CM | POA: Diagnosis not present

## 2013-11-28 DIAGNOSIS — E785 Hyperlipidemia, unspecified: Secondary | ICD-10-CM | POA: Diagnosis not present

## 2013-11-28 DIAGNOSIS — M79609 Pain in unspecified limb: Secondary | ICD-10-CM | POA: Diagnosis not present

## 2013-12-06 ENCOUNTER — Encounter: Payer: Medicare Other | Admitting: Internal Medicine

## 2013-12-12 DIAGNOSIS — H2510 Age-related nuclear cataract, unspecified eye: Secondary | ICD-10-CM | POA: Diagnosis not present

## 2013-12-12 DIAGNOSIS — H5200 Hypermetropia, unspecified eye: Secondary | ICD-10-CM | POA: Diagnosis not present

## 2013-12-12 DIAGNOSIS — H524 Presbyopia: Secondary | ICD-10-CM | POA: Diagnosis not present

## 2014-03-21 ENCOUNTER — Other Ambulatory Visit: Payer: Self-pay

## 2014-03-21 MED ORDER — PRAVASTATIN SODIUM 40 MG PO TABS: ORAL_TABLET | ORAL | Status: AC

## 2014-03-23 DIAGNOSIS — L821 Other seborrheic keratosis: Secondary | ICD-10-CM | POA: Diagnosis not present

## 2014-04-18 DIAGNOSIS — E785 Hyperlipidemia, unspecified: Secondary | ICD-10-CM | POA: Diagnosis not present

## 2014-04-18 DIAGNOSIS — Z125 Encounter for screening for malignant neoplasm of prostate: Secondary | ICD-10-CM | POA: Diagnosis not present

## 2014-04-18 DIAGNOSIS — I10 Essential (primary) hypertension: Secondary | ICD-10-CM | POA: Diagnosis not present

## 2014-04-24 ENCOUNTER — Other Ambulatory Visit: Payer: Self-pay | Admitting: Internal Medicine

## 2014-04-24 DIAGNOSIS — Z1331 Encounter for screening for depression: Secondary | ICD-10-CM | POA: Diagnosis not present

## 2014-04-24 DIAGNOSIS — E669 Obesity, unspecified: Secondary | ICD-10-CM | POA: Diagnosis not present

## 2014-04-24 DIAGNOSIS — Z6832 Body mass index (BMI) 32.0-32.9, adult: Secondary | ICD-10-CM | POA: Diagnosis not present

## 2014-04-24 DIAGNOSIS — Z23 Encounter for immunization: Secondary | ICD-10-CM | POA: Diagnosis not present

## 2014-04-24 DIAGNOSIS — G4733 Obstructive sleep apnea (adult) (pediatric): Secondary | ICD-10-CM | POA: Diagnosis not present

## 2014-04-24 DIAGNOSIS — I10 Essential (primary) hypertension: Secondary | ICD-10-CM | POA: Diagnosis not present

## 2014-04-24 DIAGNOSIS — Z0000 Encounter for general adult medical examination without abnormal findings: Secondary | ICD-10-CM | POA: Diagnosis not present

## 2014-04-24 DIAGNOSIS — Z139 Encounter for screening, unspecified: Secondary | ICD-10-CM

## 2014-04-24 DIAGNOSIS — E785 Hyperlipidemia, unspecified: Secondary | ICD-10-CM | POA: Diagnosis not present

## 2014-04-25 DIAGNOSIS — Z1212 Encounter for screening for malignant neoplasm of rectum: Secondary | ICD-10-CM | POA: Diagnosis not present

## 2014-05-02 ENCOUNTER — Ambulatory Visit
Admission: RE | Admit: 2014-05-02 | Discharge: 2014-05-02 | Disposition: A | Discharge status: Home / Self Care | Payer: Medicare Other | Source: Ambulatory Visit | Attending: Internal Medicine | Admitting: Internal Medicine

## 2014-05-02 DIAGNOSIS — Z139 Encounter for screening, unspecified: Secondary | ICD-10-CM

## 2014-05-02 DIAGNOSIS — Z136 Encounter for screening for cardiovascular disorders: Secondary | ICD-10-CM | POA: Diagnosis not present

## 2014-05-11 DIAGNOSIS — L570 Actinic keratosis: Secondary | ICD-10-CM | POA: Diagnosis not present

## 2014-05-11 DIAGNOSIS — D239 Other benign neoplasm of skin, unspecified: Secondary | ICD-10-CM | POA: Diagnosis not present

## 2014-05-11 DIAGNOSIS — L821 Other seborrheic keratosis: Secondary | ICD-10-CM | POA: Diagnosis not present

## 2014-05-11 DIAGNOSIS — Z85828 Personal history of other malignant neoplasm of skin: Secondary | ICD-10-CM | POA: Diagnosis not present

## 2014-05-23 ENCOUNTER — Encounter: Payer: Self-pay | Admitting: *Deleted

## 2014-05-24 ENCOUNTER — Ambulatory Visit (INDEPENDENT_AMBULATORY_CARE_PROVIDER_SITE_OTHER): Payer: Medicare Other | Admitting: Neurology

## 2014-05-24 ENCOUNTER — Encounter (INDEPENDENT_AMBULATORY_CARE_PROVIDER_SITE_OTHER): Payer: Self-pay | Admitting: Neurology

## 2014-05-24 ENCOUNTER — Other Ambulatory Visit: Payer: Self-pay | Admitting: Neurology

## 2014-05-24 ENCOUNTER — Encounter: Payer: Self-pay | Admitting: Neurology

## 2014-05-24 VITALS — BP 130/75 | HR 85 | Resp 16 | Ht 71.5 in | Wt 235.0 lb

## 2014-05-24 DIAGNOSIS — M2619 Other specified anomalies of jaw-cranial base relationship: Secondary | ICD-10-CM

## 2014-05-24 DIAGNOSIS — Z9989 Dependence on other enabling machines and devices: Principal | ICD-10-CM

## 2014-05-24 DIAGNOSIS — G4733 Obstructive sleep apnea (adult) (pediatric): Secondary | ICD-10-CM | POA: Diagnosis not present

## 2014-05-24 DIAGNOSIS — R0902 Hypoxemia: Secondary | ICD-10-CM

## 2014-05-24 DIAGNOSIS — M2610 Unspecified anomaly of jaw-cranial base relationship: Secondary | ICD-10-CM | POA: Diagnosis not present

## 2014-05-24 DIAGNOSIS — E669 Obesity, unspecified: Secondary | ICD-10-CM

## 2014-05-24 NOTE — Patient Instructions (Signed)
Sleep Apnea  Sleep apnea is a sleep disorder characterized by abnormal pauses in breathing while you sleep. When your breathing pauses, the level of oxygen in your blood decreases. This causes you to move out of deep sleep and into light sleep. As a result, your quality of sleep is poor, and the system that carries your blood throughout your body (cardiovascular system) experiences stress. If sleep apnea remains untreated, the following conditions can develop:  High blood pressure (hypertension).  Coronary artery disease.  Inability to achieve or maintain an erection (impotence).  Impairment of your thought process (cognitive dysfunction). There are three types of sleep apnea: 1. Obstructive sleep apnea--Pauses in breathing during sleep because of a blocked airway. 2. Central sleep apnea--Pauses in breathing during sleep because the area of the brain that controls your breathing does not send the correct signals to the muscles that control breathing. 3. Mixed sleep apnea--A combination of both obstructive and central sleep apnea. RISK FACTORS The following risk factors can increase your risk of developing sleep apnea:  Being overweight.  Smoking.  Having narrow passages in your nose and throat.  Being of older age.  Being male.  Alcohol use.  Sedative and tranquilizer use.  Ethnicity. Among individuals younger than 35 years, African Americans are at increased risk of sleep apnea. SYMPTOMS   Difficulty staying asleep.  Daytime sleepiness and fatigue.  Loss of energy.  Irritability.  Loud, heavy snoring.  Morning headaches.  Trouble concentrating.  Forgetfulness.  Decreased interest in sex. DIAGNOSIS  In order to diagnose sleep apnea, your caregiver will perform a physical examination. Your caregiver may suggest that you take a home sleep test. Your caregiver may also recommend that you spend the night in a sleep lab. In the sleep lab, several monitors record  information about your heart, lungs, and brain while you sleep. Your leg and arm movements and blood oxygen level are also recorded. TREATMENT The following actions may help to resolve mild sleep apnea:  Sleeping on your side.   Using a decongestant if you have nasal congestion.   Avoiding the use of depressants, including alcohol, sedatives, and narcotics.   Losing weight and modifying your diet if you are overweight. There also are devices and treatments to help open your airway:  Oral appliances. These are custom-made mouthpieces that shift your lower jaw forward and slightly open your bite. This opens your airway.  Devices that create positive airway pressure. This positive pressure "splints" your airway open to help you breathe better during sleep. The following devices create positive airway pressure:  Continuous positive airway pressure (CPAP) device. The CPAP device creates a continuous level of air pressure with an air pump. The air is delivered to your airway through a mask while you sleep. This continuous pressure keeps your airway open.  Nasal expiratory positive airway pressure (EPAP) device. The EPAP device creates positive air pressure as you exhale. The device consists of single-use valves, which are inserted into each nostril and held in place by adhesive. The valves create very little resistance when you inhale but create much more resistance when you exhale. That increased resistance creates the positive airway pressure. This positive pressure while you exhale keeps your airway open, making it easier to breath when you inhale again.  Bilevel positive airway pressure (BPAP) device. The BPAP device is used mainly in patients with central sleep apnea. This device is similar to the CPAP device because it also uses an air pump to deliver continuous air pressure   through a mask. However, with the BPAP machine, the pressure is set at two different levels. The pressure when you  exhale is lower than the pressure when you inhale.  Surgery. Typically, surgery is only done if you cannot comply with less invasive treatments or if the less invasive treatments do not improve your condition. Surgery involves removing excess tissue in your airway to create a wider passage way. Document Released: 10/10/2002 Document Revised: 02/14/2013 Document Reviewed: 02/26/2012 ExitCare Patient Information 2015 [employer]. This information is not intended to replace advice given to you by your health care provider. Make sure you discuss any questions you have with your health care provider.  

## 2014-05-24 NOTE — Progress Notes (Signed)
[facility]   Provider:  Larey Seat, M D  Referring Provider: Hendricks Limes, MD Primary Care Physician:  Marton Redwood, MD  Chief Complaint  Patient presents with  . New Evaluation    Room 11  . Sleep consult    HPI:  Danny Kennedy is a 68 y.o. male  Is seen here as a referral  from Dr.Shaw for a re-evaluation of sleep apnea.    [name] states that he was diagnosed with obstructive sleep apnea is so many years ago he doesn't really know when the diagnosis want was made. In also Dr. Claiborne Rigg ENT- physician had sent him to [facility] hospital about 18 years ago for a sleep study, he was given the CPAP after this the evaluation. Dr Linna Darner followed him at the time.  He had [employer] as his DME, but he couldn't get any new masks after not having seen a physician in so many years. He never had physician  follow up in regards to CPAP.  The patient has been using an 11 cm water pressure setting as long as he can remember. He feels that the CPAP is working fine for him , but  needs his new equipment. He cannot sleep without his machine. He snores loudly without his machine.   Bedtime is 11 Pm and he will fall asleep promptly ( ' in about 2 minutes '), he wakes once a night for a bathroom break and sleeps through until 7 AM, spontaneously, no alarm is needed.  During the day , he drinks diet cokes, no coffee .  ETOH 2 beers a week, and no tobacco use.  He doesn't nap.   He does have firearms in the house. Hunting guns.     Review of Systems: Out of a complete 14 system review, the patient complains of only the following symptoms, and all other reviewed systems are negative. GDS 2 points, Epworth  7 points, FSS 27  Points,   History   Social History  . Marital Status: Married    Spouse Name: Danny Kennedy    Number of Children: 2  . Years of Education: College   Occupational History  . executive   .     Social History Main Topics  . Smoking  status: Former Smoker    Quit date: 11/03/1990  . Smokeless tobacco: Former Systems developer    Types: Chew     Comment: Quit 2005  . Alcohol Use: 0.0 oz/week     Comment: 2 cans beer a month  . Drug Use: No  . Sexual Activity: Not on file   Other Topics Concern  . Not on file   Social History Narrative   Patient is married Danny Kennedy) and lives at home with his wife.   Patient owns [employer].   Patient has a college education.   Patient has two sons and four grandchildren.   Patient is right-handed.   Patient drinks coffee, Diet cokes about four a week.             Family History  Problem Relation Age of Onset  . Hypertension Maternal Grandmother   . Stroke Maternal Grandmother 72  . Diabetes Maternal Grandmother   . Heart attack Paternal Uncle     X2, neither pre 55  . Heart attack Maternal Uncle     not pre 55  . Cancer Father     LUNG ,THROAT  . Esophageal cancer Father   .  Lung cancer Maternal Grandfather     Past Medical History  Diagnosis Date  . Sleep apnea     CPAP @ 11cm  . Dupuytren contracture     RUE @ thumb  . Hyperlipemia     LDL goal = < 100  . Gout   . Diverticulosis   . Nephrolithiasis [id number]    Dr Serita Butcher, X 2   . Unspecified essential hypertension 05/17/2009  . Alopecia   . OSA on CPAP     Past Surgical History  Procedure Laterality Date  . Colonoscopy w/ polypectomy  2005, 2007    x2 negative 10-2007, Dr Sharlett Iles   . Renal calculus      retirieved via basket 1984; stone passed spontaneously 2011; Otic tube R TM  . Wisdom tooth extraction      Current Outpatient Prescriptions  Medication Sig Dispense Refill  . aspirin 81 MG tablet Take 81 mg by mouth daily.        . benazepril (LOTENSIN) 20 MG tablet take 1 tablet by mouth once daily  30 tablet  0  . desonide (DESOWEN) 0.05 % lotion Apply 1 application topically as needed.      . finasteride (PROSCAR) 5 MG tablet Take 5 mg by mouth. 1/2 by mouth 3 x weekly      .  Glucosamine-Chondroit-Vit C-Mn (GLUCOSAMINE 1500 COMPLEX PO) Take 2 tablets by mouth daily.      . multivitamin (THERAGRAN) per tablet Take 1 tablet by mouth daily.        . pravastatin (PRAVACHOL) 40 MG tablet take 1 tablet by mouth at bedtime  90 tablet  1   No current facility-administered medications for this visit.    Allergies as of 05/24/2014  . (No Known Allergies)    Vitals: BP 130/75  Pulse 85  Resp 16  Ht 5' 11.5" (1.816 m)  Wt 235 lb (106.595 kg)  BMI 32.32 kg/m2 Last Weight:  Wt Readings from Last 1 Encounters:  05/24/14 235 lb (106.595 kg)   Last Height:   Ht Readings from Last 1 Encounters:  05/24/14 5' 11.5" (1.816 m)    Physical exam:  General: The patient is awake, alert and appears not in acute distress. The patient is well groomed. Head: Normocephalic, atraumatic. Neck is supple. Mallampati 3 , very low and narrow airway, neck circumference: 19 inches.   Nasal congestions.  Cardiovascular:  Regular rate and rhythm , without  murmurs or carotid bruit, and without distended neck veins. Respiratory: Lungs are clear to auscultation. Skin:  Without evidence of edema, or rash Trunk: BMI is  elevated and patient  has normal posture.  Neurologic exam : The patient is awake and alert, oriented to place and time.  Memory subjective  described as intact.  There is a normal attention span & concentration ability. Speech is fluent without  dysarthria, dysphonia or aphasia. Mood and affect are appropriate.  Cranial nerves: Pupils are equal and briskly reactive to light. Funduscopic exam without  evidence of pallor or edema.  Extraocular movements  in vertical and horizontal planes intact and without nystagmus. Visual fields by finger perimetry are intact. Hearing to finger rub intact.  Facial sensation intact to fine touch. Facial motor strength is symmetric and tongue and uvula move midline.  Motor exam: Normal tone , muscle bulk and symmetric normal strength in  all extremities.  Sensory:  Fine touch, pinprick and vibration were tested in all extremities. Proprioception is  normal.  Coordination: Rapid alternating movements  in the fingers/hands is tested and normal.   Gait and station: Patient walks without assistive device ,climbs up to the exam table.  Strength within normal limits. Stance is stable and normal.  Deep tendon reflexes: in the upper and lower extremities are symmetric ,achilles tendon reflex is attenuated, patella is attenuated.Babinski maneuver response is downgoing.   Assessment:  After physical and neurologic examination, review of laboratory studies, imaging, neurophysiology testing and pre-existing records, assessment is   1) Patient with a 20 plus year history of apnea. currently happy on 11 cm water.   Plan:  Treatment plan and additional workup :  1) I will inquire if we need a HST to confirm that apnea is still present, by clinical description it is . 2) [employer] , send order for new tubing, filter , headgear and mask.  Nasal F 10 , please, fit to patients need. 3) new CPAP machine likely needed.

## 2014-06-01 ENCOUNTER — Other Ambulatory Visit: Payer: Self-pay | Admitting: Internal Medicine

## 2014-06-10 ENCOUNTER — Telehealth: Payer: Self-pay | Admitting: Neurology

## 2014-06-10 DIAGNOSIS — G4733 Obstructive sleep apnea (adult) (pediatric): Secondary | ICD-10-CM

## 2014-06-10 NOTE — Telephone Encounter (Signed)
Called the patient and discussed home sleep test results.  Explained finding of mild osa associated with hypoxemia and Dr. Edwena Felty recommendation for a titration study.  Pt does not wish to have a sleep study in the lab.  I have advised the patient that I would send notification to Dr. Brett Fairy regarding his request for APAP.

## 2014-06-12 NOTE — Addendum Note (Signed)
Addended by: Larey Seat on: 06/12/2014 05:16 PM   Modules accepted: Orders

## 2014-06-21 ENCOUNTER — Telehealth: Payer: Self-pay | Admitting: *Deleted

## 2014-06-21 NOTE — Telephone Encounter (Signed)
Pt is here with Danny Kennedy with [facility].  Needs verbal order on pressures for CPAP.  [phone].

## 2014-06-21 NOTE — Telephone Encounter (Signed)
I consulted Dr. Brett Fairy and CPAP pressure 11cm H20.    Called and relayed to Linzie Collin with [facility].  He verbalized understanding.

## 2014-06-22 ENCOUNTER — Other Ambulatory Visit: Payer: Self-pay | Admitting: *Deleted

## 2014-06-22 ENCOUNTER — Encounter: Payer: Self-pay | Admitting: Neurology

## 2014-06-22 DIAGNOSIS — G4733 Obstructive sleep apnea (adult) (pediatric): Secondary | ICD-10-CM

## 2014-06-30 ENCOUNTER — Other Ambulatory Visit: Payer: Self-pay | Admitting: Neurology

## 2014-06-30 DIAGNOSIS — G4733 Obstructive sleep apnea (adult) (pediatric): Secondary | ICD-10-CM

## 2014-06-30 DIAGNOSIS — Z9989 Dependence on other enabling machines and devices: Principal | ICD-10-CM

## 2014-07-03 DIAGNOSIS — J209 Acute bronchitis, unspecified: Secondary | ICD-10-CM | POA: Diagnosis not present

## 2014-07-25 ENCOUNTER — Encounter: Payer: Self-pay | Admitting: Neurology

## 2014-07-25 ENCOUNTER — Ambulatory Visit (INDEPENDENT_AMBULATORY_CARE_PROVIDER_SITE_OTHER): Payer: Medicare Other | Admitting: Neurology

## 2014-07-25 ENCOUNTER — Encounter (INDEPENDENT_AMBULATORY_CARE_PROVIDER_SITE_OTHER): Payer: Self-pay

## 2014-07-25 VITALS — BP 139/88 | HR 78 | Resp 16 | Ht 71.5 in | Wt 235.0 lb

## 2014-07-25 DIAGNOSIS — G4733 Obstructive sleep apnea (adult) (pediatric): Secondary | ICD-10-CM | POA: Insufficient documentation

## 2014-07-25 DIAGNOSIS — G478 Other sleep disorders: Secondary | ICD-10-CM

## 2014-07-25 DIAGNOSIS — Z9989 Dependence on other enabling machines and devices: Principal | ICD-10-CM

## 2014-07-25 NOTE — Patient Instructions (Signed)
Sleep Apnea  Sleep apnea is a sleep disorder characterized by abnormal pauses in breathing while you sleep. When your breathing pauses, the level of oxygen in your blood decreases. This causes you to move out of deep sleep and into light sleep. As a result, your quality of sleep is poor, and the system that carries your blood throughout your body (cardiovascular system) experiences stress. If sleep apnea remains untreated, the following conditions can develop:  High blood pressure (hypertension).  Coronary artery disease.  Inability to achieve or maintain an erection (impotence).  Impairment of your thought process (cognitive dysfunction). There are three types of sleep apnea: 1. Obstructive sleep apnea--Pauses in breathing during sleep because of a blocked airway. 2. Central sleep apnea--Pauses in breathing during sleep because the area of the brain that controls your breathing does not send the correct signals to the muscles that control breathing. 3. Mixed sleep apnea--A combination of both obstructive and central sleep apnea. RISK FACTORS The following risk factors can increase your risk of developing sleep apnea:  Being overweight.  Smoking.  Having narrow passages in your nose and throat.  Being of older age.  Being male.  Alcohol use.  Sedative and tranquilizer use.  Ethnicity. Among individuals younger than 35 years, African Americans are at increased risk of sleep apnea. SYMPTOMS   Difficulty staying asleep.  Daytime sleepiness and fatigue.  Loss of energy.  Irritability.  Loud, heavy snoring.  Morning headaches.  Trouble concentrating.  Forgetfulness.  Decreased interest in sex. DIAGNOSIS  In order to diagnose sleep apnea, your caregiver will perform a physical examination. Your caregiver may suggest that you take a home sleep test. Your caregiver may also recommend that you spend the night in a sleep lab. In the sleep lab, several monitors record  information about your heart, lungs, and brain while you sleep. Your leg and arm movements and blood oxygen level are also recorded. TREATMENT The following actions may help to resolve mild sleep apnea:  Sleeping on your side.   Using a decongestant if you have nasal congestion.   Avoiding the use of depressants, including alcohol, sedatives, and narcotics.   Losing weight and modifying your diet if you are overweight. There also are devices and treatments to help open your airway:  Oral appliances. These are custom-made mouthpieces that shift your lower jaw forward and slightly open your bite. This opens your airway.  Devices that create positive airway pressure. This positive pressure "splints" your airway open to help you breathe better during sleep. The following devices create positive airway pressure:  Continuous positive airway pressure (CPAP) device. The CPAP device creates a continuous level of air pressure with an air pump. The air is delivered to your airway through a mask while you sleep. This continuous pressure keeps your airway open.  Nasal expiratory positive airway pressure (EPAP) device. The EPAP device creates positive air pressure as you exhale. The device consists of single-use valves, which are inserted into each nostril and held in place by adhesive. The valves create very little resistance when you inhale but create much more resistance when you exhale. That increased resistance creates the positive airway pressure. This positive pressure while you exhale keeps your airway open, making it easier to breath when you inhale again.  Bilevel positive airway pressure (BPAP) device. The BPAP device is used mainly in patients with central sleep apnea. This device is similar to the CPAP device because it also uses an air pump to deliver continuous air pressure   through a mask. However, with the BPAP machine, the pressure is set at two different levels. The pressure when you  exhale is lower than the pressure when you inhale.  Surgery. Typically, surgery is only done if you cannot comply with less invasive treatments or if the less invasive treatments do not improve your condition. Surgery involves removing excess tissue in your airway to create a wider passage way. Document Released: 10/10/2002 Document Revised: 02/14/2013 Document Reviewed: 02/26/2012 ExitCare Patient Information 2015 [employer]. This information is not intended to replace advice given to you by your health care provider. Make sure you discuss any questions you have with your health care provider.  

## 2014-07-25 NOTE — Progress Notes (Signed)
[facility]   Provider:  Larey Seat, M D  Referring Provider: Marton Redwood, MD Primary Care Physician:  Marton Redwood, MD  Chief Complaint  Patient presents with  . Follow-up    Room 11  . Sleep Apnea    HPI:  Danny Kennedy is a 68 y.o. male  Is seen here as a referral  from Dr.Shaw for a revisit after re-evaluation of sleep apnea.   Interval history double-point Danny Kennedy underwent a home sleep test. He has been treated for many years the CPAP at 11 cm water but he felt that he was still snoring loudly when not using her CPAP machine however he was not short of apnea was still present and her CPAP is even required any longer. His home sleep test revealed an AHI of 6.3 the lowest oxygen saturation was 85% but nor oxygen levels maintained for quite a while 127 minutes of recorded time. For this diagnosis of mild sleep apnea with hypoxemia CPAP was still a valid option to treat for treatment. If the hypoxemia would not be associated I would have preferred a dental device alone to treat apnea as long as mild as in this case.  The patient return for a CPAP visit today, after using an auto-titrator. Download 07-25-14 documented AHI 0.6 and 95% pressure at 11 cm , no longer sleep choking.  7 hours 16 minutes, 97% compliance over  35 days of auto-titration.  FSS at 15 points down from 27,  Epworth 5 points from 7 points , GDS 1 from 2 .  He feels slightly  more refreshed and sleeps only marginally sounder.       Consultation note: CD Danny Kennedy states that he was diagnosed with obstructive sleep apnea is so many years ago he doesn't really know when the diagnosis want was made. In also Dr. Claiborne Rigg ENT- physician had sent him to [facility] about 18 years ago for a sleep study, he was given the CPAP after this the evaluation. Dr Linna Darner followed him at the time.  He had [employer] as his DME, but he couldn't get any new masks after not having seen a  physician in so many years. He never had physician  follow up in regards to CPAP.  The patient has been using an 11 cm water pressure setting as long as he can remember. He feels that the CPAP is working fine for him , but needs his new equipment. He cannot sleep without his machine. He snores loudly without his machine.   Bedtime is 11 Pm and he will fall asleep promptly ( ' in about 2 minutes '), he wakes once a night for a bathroom break and sleeps through until 7 AM, spontaneously, no alarm is needed.  During the day , he drinks diet cokes, no coffee .  ETOH 2 beers a week, and no tobacco use. He doesn't nap. He does have firearms in the house. Hunting guns.     Review of Systems: Out of a complete 14 system review, the patient complains of only the following symptoms, and all other reviewed systems are negative. GDS 2 points, Epworth  7 points, FSS 27  Points,   History   Social History  . Marital Status: Married    Spouse Name: Danny Kennedy    Number of Children: 2  . Years of Education: College   Occupational History  . executive   .     Social History Main Topics  .  Smoking status: Former Smoker    Quit date: 11/03/1990  . Smokeless tobacco: Former Systems developer    Types: Chew     Comment: Quit 2005  . Alcohol Use: 0.0 oz/week     Comment: 2 cans beer a month  . Drug Use: No  . Sexual Activity: Not on file   Other Topics Concern  . Not on file   Social History Narrative   Patient is married Danny Kennedy) and lives at home with his wife.   Patient owns [employer].   Patient has a college education.   Patient has two sons and four grandchildren.   Patient is right-handed.   Patient drinks coffee, Diet cokes about four + a week.                Family History  Problem Relation Age of Onset  . Hypertension Maternal Grandmother   . Stroke Maternal Grandmother 72  . Diabetes Maternal Grandmother   . Heart attack Paternal Uncle     X2, neither pre 55  . Heart  attack Maternal Uncle     not pre 55  . Cancer Father     LUNG ,THROAT  . Esophageal cancer Father   . Lung cancer Maternal Grandfather     Past Medical History  Diagnosis Date  . Sleep apnea     CPAP @ 11cm  . Dupuytren contracture     RUE @ thumb  . Hyperlipemia     LDL goal = < 100  . Gout   . Diverticulosis   . Nephrolithiasis [id number]    Dr Serita Butcher, X 2   . Unspecified essential hypertension 05/17/2009  . Alopecia   . OSA on CPAP     Past Surgical History  Procedure Laterality Date  . Colonoscopy w/ polypectomy  2005, 2007    x2 negative 10-2007, Dr Sharlett Iles   . Renal calculus      retirieved via basket 1984; stone passed spontaneously 2011; Otic tube R TM  . Wisdom tooth extraction      Current Outpatient Prescriptions  Medication Sig Dispense Refill  . aspirin 81 MG tablet Take 81 mg by mouth daily.        . benazepril (LOTENSIN) 20 MG tablet take 1 tablet by mouth once daily  30 tablet  0  . desonide (DESOWEN) 0.05 % lotion Apply 1 application topically as needed.      . finasteride (PROSCAR) 5 MG tablet take 1 tablet by mouth once daily as directed  30 tablet  1  . Glucosamine-Chondroit-Vit C-Mn (GLUCOSAMINE 1500 COMPLEX PO) Take 2 tablets by mouth daily.      . multivitamin (THERAGRAN) per tablet Take 1 tablet by mouth daily.        . pravastatin (PRAVACHOL) 40 MG tablet take 1 tablet by mouth at bedtime  90 tablet  1   No current facility-administered medications for this visit.    Allergies as of 07/25/2014  . (No Known Allergies)    Vitals: BP 139/88  Pulse 78  Resp 16  Ht 5' 11.5" (1.816 m)  Wt 235 lb (106.595 kg)  BMI 32.32 kg/m2 Last Weight:  Wt Readings from Last 1 Encounters:  07/25/14 235 lb (106.595 kg)   Last Height:   Ht Readings from Last 1 Encounters:  07/25/14 5' 11.5" (1.816 m)    Physical exam:  General: The patient is awake, alert and appears not in acute distress. The patient is well groomed. Head: Normocephalic,  atraumatic. Neck is supple. Mallampati 3 , very low and narrow airway, neck circumference: 19 inches.   Nasal congestions.  Cardiovascular:  Regular rate and rhythm , without  murmurs or carotid bruit, and without distended neck veins. Respiratory: Lungs are clear to auscultation. Skin:  Without evidence of edema, or rash Trunk: BMI is  elevated and patient  has normal posture.  Neurologic exam : The patient is awake and alert, oriented to place and time.  Memory subjective  described as intact.  There is a normal attention span & concentration ability. Speech is fluent without  dysarthria, dysphonia or aphasia. Mood and affect are appropriate. Cranial nerves: Pupils are equal and briskly reactive to light. Funduscopic exam without  evidence of pallor or edema.  Extraocular movements  in vertical and horizontal planes intact and without nystagmus. Visual fields by finger perimetry are intact. Hearing to finger rub intact.  Facial sensation intact to fine touch. Facial motor strength is symmetric and tongue and uvula move midline. Motor exam: Normal tone , muscle bulk and symmetric normal strength in all extremities.  Assessment:  After physical and neurologic examination, review of laboratory studies, imaging, neurophysiology testing and pre-existing records, assessment is   1) Patient with a 20 plus year history of apnea. currently happy on 11 cm water.  This was confirmed on auto titration.    Plan:  Treatment plan and additional workup :  1)  Keep using CPAP ! RV in 12 month, .

## 2015-01-18 DIAGNOSIS — H5203 Hypermetropia, bilateral: Secondary | ICD-10-CM | POA: Diagnosis not present

## 2015-01-18 DIAGNOSIS — H2513 Age-related nuclear cataract, bilateral: Secondary | ICD-10-CM | POA: Diagnosis not present

## 2015-01-18 DIAGNOSIS — H524 Presbyopia: Secondary | ICD-10-CM | POA: Diagnosis not present

## 2015-01-18 DIAGNOSIS — H52203 Unspecified astigmatism, bilateral: Secondary | ICD-10-CM | POA: Diagnosis not present

## 2015-03-16 ENCOUNTER — Encounter: Payer: Self-pay | Admitting: Gastroenterology

## 2015-04-06 ENCOUNTER — Other Ambulatory Visit: Payer: Self-pay | Admitting: Internal Medicine

## 2015-04-20 DIAGNOSIS — Z0000 Encounter for general adult medical examination without abnormal findings: Secondary | ICD-10-CM | POA: Diagnosis not present

## 2015-04-20 DIAGNOSIS — Z125 Encounter for screening for malignant neoplasm of prostate: Secondary | ICD-10-CM | POA: Diagnosis not present

## 2015-04-20 DIAGNOSIS — E785 Hyperlipidemia, unspecified: Secondary | ICD-10-CM | POA: Diagnosis not present

## 2015-04-20 DIAGNOSIS — I10 Essential (primary) hypertension: Secondary | ICD-10-CM | POA: Diagnosis not present

## 2015-04-27 DIAGNOSIS — E669 Obesity, unspecified: Secondary | ICD-10-CM | POA: Diagnosis not present

## 2015-04-27 DIAGNOSIS — E785 Hyperlipidemia, unspecified: Secondary | ICD-10-CM | POA: Diagnosis not present

## 2015-04-27 DIAGNOSIS — G4733 Obstructive sleep apnea (adult) (pediatric): Secondary | ICD-10-CM | POA: Diagnosis not present

## 2015-04-27 DIAGNOSIS — Z0000 Encounter for general adult medical examination without abnormal findings: Secondary | ICD-10-CM | POA: Diagnosis not present

## 2015-04-27 DIAGNOSIS — R739 Hyperglycemia, unspecified: Secondary | ICD-10-CM | POA: Diagnosis not present

## 2015-04-27 DIAGNOSIS — R7301 Impaired fasting glucose: Secondary | ICD-10-CM | POA: Diagnosis not present

## 2015-04-27 DIAGNOSIS — I129 Hypertensive chronic kidney disease with stage 1 through stage 4 chronic kidney disease, or unspecified chronic kidney disease: Secondary | ICD-10-CM | POA: Diagnosis not present

## 2015-04-27 DIAGNOSIS — M79641 Pain in right hand: Secondary | ICD-10-CM | POA: Diagnosis not present

## 2015-04-27 DIAGNOSIS — Z6832 Body mass index (BMI) 32.0-32.9, adult: Secondary | ICD-10-CM | POA: Diagnosis not present

## 2015-04-27 DIAGNOSIS — Z1389 Encounter for screening for other disorder: Secondary | ICD-10-CM | POA: Diagnosis not present

## 2015-04-27 DIAGNOSIS — I10 Essential (primary) hypertension: Secondary | ICD-10-CM | POA: Diagnosis not present

## 2015-04-27 DIAGNOSIS — N182 Chronic kidney disease, stage 2 (mild): Secondary | ICD-10-CM | POA: Diagnosis not present

## 2015-05-17 DIAGNOSIS — L905 Scar conditions and fibrosis of skin: Secondary | ICD-10-CM | POA: Diagnosis not present

## 2015-05-17 DIAGNOSIS — D224 Melanocytic nevi of scalp and neck: Secondary | ICD-10-CM | POA: Diagnosis not present

## 2015-05-17 DIAGNOSIS — Z85828 Personal history of other malignant neoplasm of skin: Secondary | ICD-10-CM | POA: Diagnosis not present

## 2015-05-17 DIAGNOSIS — D225 Melanocytic nevi of trunk: Secondary | ICD-10-CM | POA: Diagnosis not present

## 2015-05-17 DIAGNOSIS — L821 Other seborrheic keratosis: Secondary | ICD-10-CM | POA: Diagnosis not present

## 2015-07-26 ENCOUNTER — Institutional Professional Consult (permissible substitution): Payer: Medicare Other | Admitting: Neurology

## 2015-08-15 ENCOUNTER — Ambulatory Visit (INDEPENDENT_AMBULATORY_CARE_PROVIDER_SITE_OTHER): Payer: Medicare Other | Admitting: Neurology

## 2015-08-15 ENCOUNTER — Encounter: Payer: Self-pay | Admitting: Neurology

## 2015-08-15 VITALS — BP 142/82 | HR 78 | Resp 20 | Ht 71.0 in | Wt 232.0 lb

## 2015-08-15 DIAGNOSIS — G4733 Obstructive sleep apnea (adult) (pediatric): Secondary | ICD-10-CM

## 2015-08-15 DIAGNOSIS — Z9989 Dependence on other enabling machines and devices: Principal | ICD-10-CM

## 2015-08-15 DIAGNOSIS — J302 Other seasonal allergic rhinitis: Secondary | ICD-10-CM

## 2015-08-15 NOTE — Progress Notes (Signed)
[facility]   Provider:  Larey Seat, M D  Referring Provider: Marton Redwood, MD Primary Care Physician:  Marton Redwood, MD  Chief Complaint  Patient presents with  . Follow-up    cpap, rm 11, alone    HPI:  Ector Laurel is a 69 y.o. male  Is seen here for a revisit with compliance of CPAP , in the treatment of sleep apnea.     Consultation note: CD [name] states that he was diagnosed with obstructive sleep apnea is so many years ago he doesn't really know when exactly the diagnosis was made. Dr. Claiborne Rigg ENT- physician had sent him to [facility] hospital about 18 years ago for a sleep study, he was given the CPAP after this evaluation. Dr. Linna Darner  PCP followed him at the time.  He had [employer] as his DME, but he couldn't get any new masks after not having seen a physician in so many years.  He never had physician follow up in regards to CPAP.  The patient has been using an 11 cm water pressure setting as long as he can remember. He feels that the CPAP is working fine for him , but needs his new equipment. He cannot sleep without his machine. He snores loudly without his machine.   Bedtime is 11 Pm and he will fall asleep promptly ( ' in about 2 minutes '), he wakes once a night for a bathroom break and sleeps through until 7 AM, spontaneously, no alarm is needed.  During the day , he drinks diet cokes, no coffee.  ETOH intake is 2 beers a week, and no tobacco use.  He doesn't nap. He does have firearms in the house- [employer], those a locked up.   Interval history : [name] underwent a home sleep test. He has been treated for many years with a  CPA Pressure at 11 cm water,  but he felt that he was still snoring loudly when not using her CPAP machine however he was not short of apnea was still present and her CPAP is even required any longer. His home sleep test revealed an AHI of 6.3 the lowest oxygen saturation was 85% but nor oxygen  levels maintained for quite a while 127 minutes of recorded time. For this diagnosis of mild sleep apnea with hypoxemia CPAP was still a valid option to treat for treatment. If the hypoxemia would not be associated I would have preferred a dental device alone to treat apnea as long as mild as in this case.  The patient return for a CPAP visit today, after using an auto-titrator. Download 07-25-14 documented AHI 0.6 and 95% pressure at 11 cm , no longer sleep choking.  7 hours 16 minutes, 97% compliance over  35 days of auto-titration.  FSS at 15 points down from 27,  Epworth 5 points from 7 points , GDS 1 from 2 .  He feels slightly more refreshed and sleeps only marginally sounder.   08-15-15 We are seeing today to see the compliance download the patient has used a CPAP 73% with over 4 hours of consecutive nightly use. The average use is 4 hours and 54 minutes. He regularly does not use this CPAP on Sunday nights, but she spends at his [place]. He has another CPAP machine at that location his overall compliance from both machines is well above 90%. His current machine is set between 6 and 11 cm pressure window was 1  cm expiratory rash or relief or EPR. His lake house machine is set at 11 cm water pressure the 95th percentile pressure for this I will total said machine is 10.9. He is doing well with that and has a residual AHI of only 0.4 - no adjustments have to be made. The patient will receive new supplies,  he actually already [facility].   He endorsed the Epworth sleepiness score at only 4 points, the fatigue severity score at only 19 points and the geriatric depression score at 2 points. He reports that one night when he forgot to bring his CPAP with him he felt his airway collapsing and actually cost him to panic. He is a very compliant CPAP user. He had no falls in the last year, he has not had an elevation of depression he keeps an active lifestyle. He likes  to deer hunt. Non smoker, Non drinker.   His wife has had recently some healthcare's and is currently in rehabilitation working on her ambulation and endurance.     Review of Systems:  Out of a complete 14 system review, the patient complains of only the following symptoms, and all other reviewed systems are negative.  last year, 2015 : GDS 2 points, Epworth  7 points, FSS 27  Points,  He endorsed the Epworth sleepiness score  2016 at only 4 points, the fatigue severity score at only 19 points and the geriatric depression score at 2 points. No nocturia, no snoring on CPAP.   Social History   Social History  . Marital Status: Married    Spouse Name: [name]  . Number of Children: 2  . Years of Education: College   Occupational History  . executive   .     Social History Main Topics  . Smoking status: Former Smoker    Quit date: 11/03/1990  . Smokeless tobacco: Former Systems developer    Types: Chew     Comment: Quit 2005  . Alcohol Use: 0.0 oz/week     Comment: 2 cans beer a month  . Drug Use: No  . Sexual Activity: Not on file   Other Topics Concern  . Not on file   Social History Narrative   Patient is married [name]) and lives at home with his wife.   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   Patient has a college education.   Patient has two sons and four grandchildren.   Patient is right-handed.   Patient drinks coffee, Diet cokes about four + a week.                Family History  Problem Relation Age of Onset  . Hypertension Maternal Grandmother   . Stroke Maternal Grandmother 72  . Diabetes Maternal Grandmother   . Heart attack Paternal Uncle     X2, neither pre 55  . Heart attack Maternal Uncle     not pre 55  . Cancer Father     LUNG ,THROAT  . Esophageal cancer Father   . Lung cancer Maternal Grandfather     Past Medical History  Diagnosis Date  . Sleep apnea     CPAP @ 11cm  . Dupuytren contracture     RUE @ thumb  . Hyperlipemia     LDL  goal = < 100  . Gout   . Diverticulosis   . Nephrolithiasis [id number]    Dr Serita Butcher, X 2   . Unspecified essential hypertension 05/17/2009  .  Alopecia   . OSA on CPAP     Past Surgical History  Procedure Laterality Date  . Colonoscopy w/ polypectomy  2005, 2007    x2 negative 10-2007, Dr Sharlett Iles   . Renal calculus      retirieved via basket 1984; stone passed spontaneously 2011; Otic tube R TM  . Wisdom tooth extraction      Current Outpatient Prescriptions  Medication Sig Dispense Refill  . aspirin 81 MG tablet Take 81 mg by mouth daily.      . benazepril (LOTENSIN) 20 MG tablet take 1 tablet by mouth once daily 30 tablet 0  . desonide (DESOWEN) 0.05 % lotion Apply 1 application topically as needed.    . finasteride (PROSCAR) 5 MG tablet take 1 tablet by mouth once daily as directed 30 tablet 1  . Glucosamine-Chondroit-Vit C-Mn (GLUCOSAMINE 1500 COMPLEX PO) Take 2 tablets by mouth daily.    . multivitamin (THERAGRAN) per tablet Take 1 tablet by mouth daily.      . pravastatin (PRAVACHOL) 40 MG tablet take 1 tablet by mouth at bedtime 90 tablet 1   No current facility-administered medications for this visit.    Allergies as of 08/15/2015  . (No Known Allergies)    Vitals: BP 142/82 mmHg  Pulse 78  Resp 20  Ht 5\' 11"  (1.803 m)  Wt 232 lb (105.235 kg)  BMI 32.37 kg/m2 Last Weight:  Wt Readings from Last 1 Encounters:  08/15/15 232 lb (105.235 kg)   Last Height:   Ht Readings from Last 1 Encounters:  08/15/15 5\' 11"  (1.803 m)    Physical exam:  General: The patient is awake, alert and appears not in acute distress. The patient is well groomed. Head: Normocephalic, atraumatic. Neck is supple. Mallampati 3, very low and narrow airway, neck circumference: 19 inches. Nasal congestions.  Cardiovascular: Regular rate and rhythm, without  murmurs or carotid bruit, and without distended neck veins. Respiratory: Lungs are clear to auscultation. Skin:  Without evidence  of edema, or rash Trunk: BMI is elevated and patient  has normal posture.  Neurologic exam : The patient is awake and alert, oriented to place and time. Memory subjective described as intact.  There is a normal attention span & concentration ability. Speech is fluent without  dysarthria, dysphonia or aphasia. Mood and affect are appropriate. Cranial nerves: Pupils are equal and briskly reactive to light. Funduscopic exam without  evidence of pallor or edema.  Extraocular movements  in vertical and horizontal planes intact and without nystagmus. Visual fields by finger perimetry are intact. Hearing to finger rub intact.  Facial sensation intact to fine touch. Facial motor strength is symmetric and tongue and uvula move midline. Motor exam: Normal tone , muscle bulk and symmetric normal strength in all extremities.  Assessment:  After physical and neurologic examination, review of laboratory studies, imaging, neurophysiology testing and pre-existing records, assessment is   1) Patient with a 20 plus year history of apnea. Currently happy on 11 cm water setting for his older machine at his second home and autoset for the at home machine, 95% 11 cm.  Plan:  Treatment plan and additional workup :  1)  Keep using CPAP ! RV in 12 month. Keep up the good compliance.    Cc: Dr. Carmie Kanner .

## 2015-08-15 NOTE — Patient Instructions (Signed)
Rv in 12 month, no changes in CPAP settings.

## 2015-08-28 DIAGNOSIS — Z85828 Personal history of other malignant neoplasm of skin: Secondary | ICD-10-CM | POA: Diagnosis not present

## 2015-08-28 DIAGNOSIS — L438 Other lichen planus: Secondary | ICD-10-CM | POA: Diagnosis not present

## 2015-09-19 DIAGNOSIS — R8290 Unspecified abnormal findings in urine: Secondary | ICD-10-CM | POA: Diagnosis not present

## 2015-09-19 DIAGNOSIS — M545 Low back pain: Secondary | ICD-10-CM | POA: Diagnosis not present

## 2015-10-03 IMAGING — US US AORTA SCREENING (MEDICARE)
1 series · 14 of 25 positions shown · non-contrast
Comparison: None.

CLINICAL DATA: Medicare screening exam for abdominal aortic
aneurysm.

EXAM:
ABDOMINAL AORTA SCREENING ULTRASOUND
TECHNIQUE: Ultrasound examination of the abdominal aorta was performed as a
screening evaluation for abdominal aortic aneurysm.

[Series 1: us aorta screening (medicare) · 0.35mm/px · 14 of 26 slices shown]
[im 1/26]
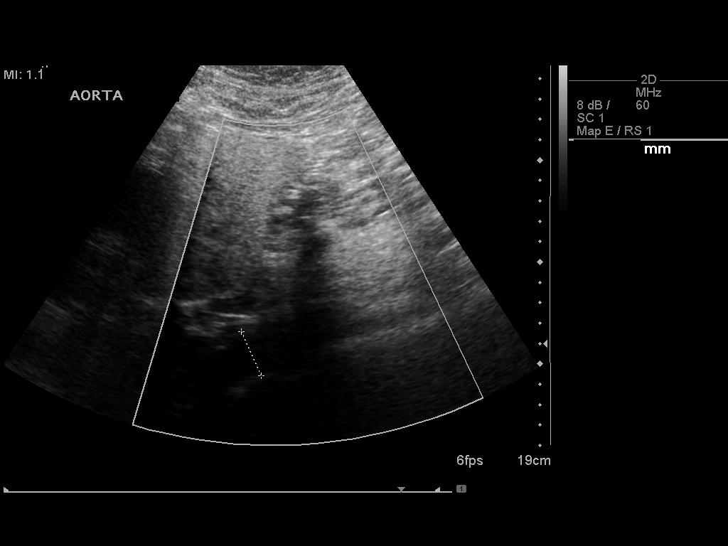
[im 3/26]
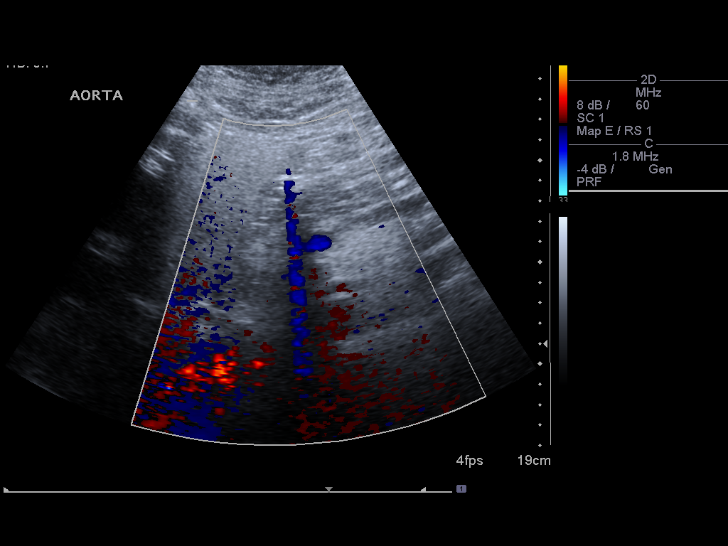
[im 5/26]
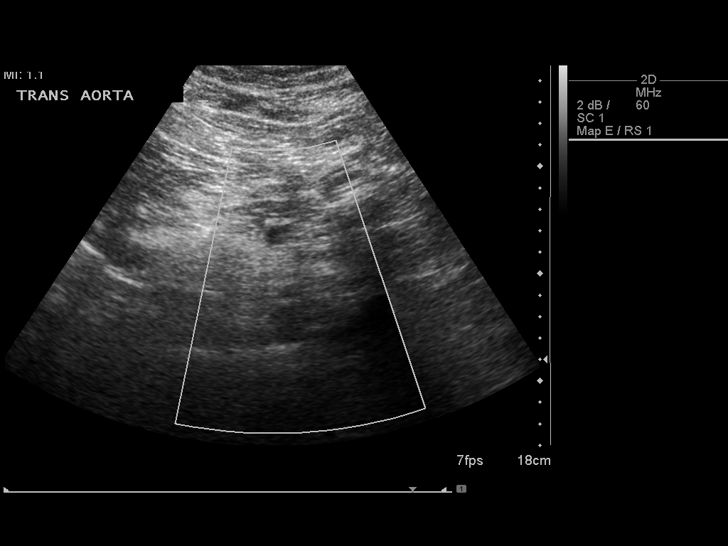
[im 7/26]
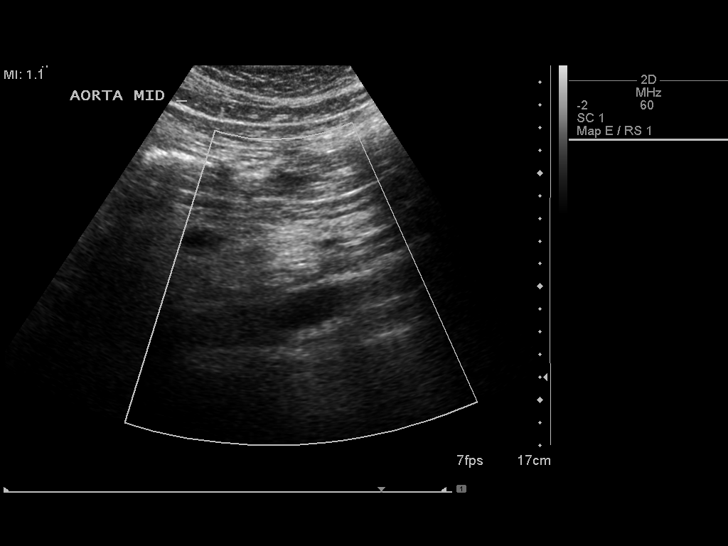
[im 9/26]
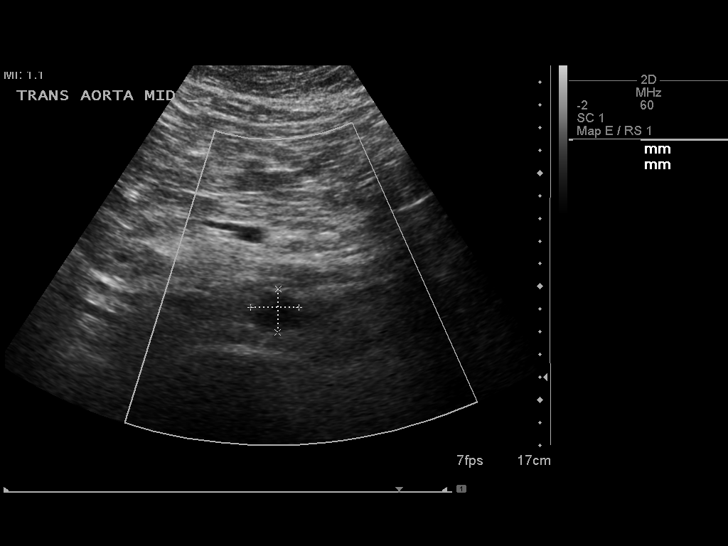
[im 10/26]
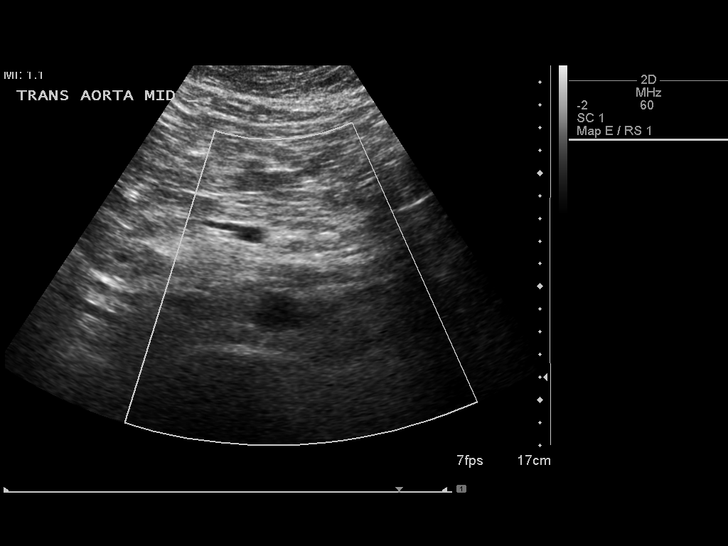
[im 12/26]
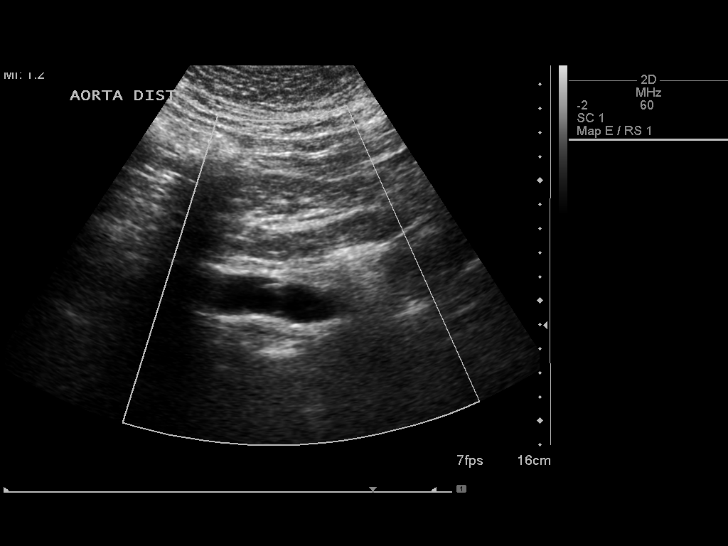
[im 14/26]
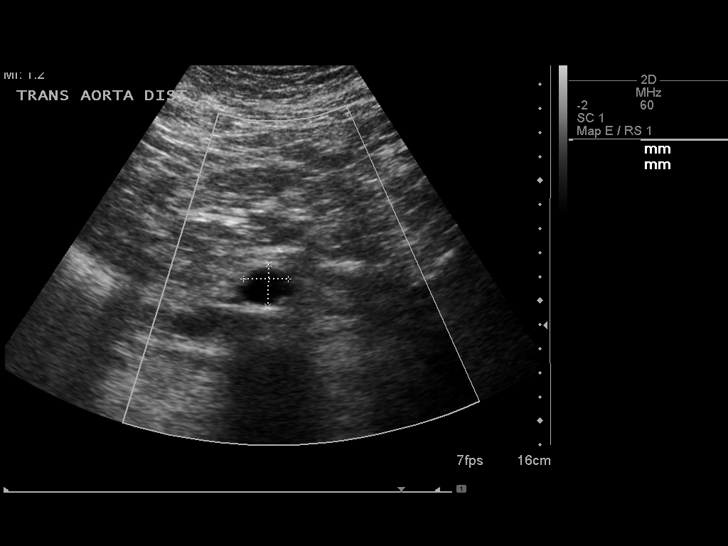
[im 16/26]
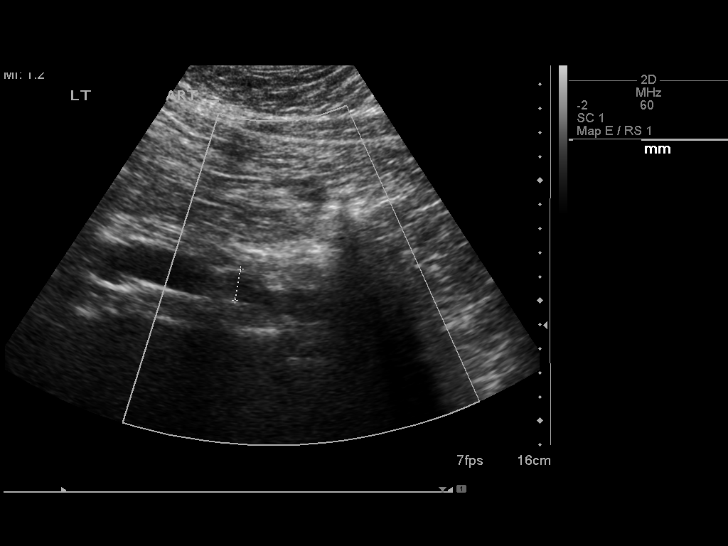
[im 17/26]
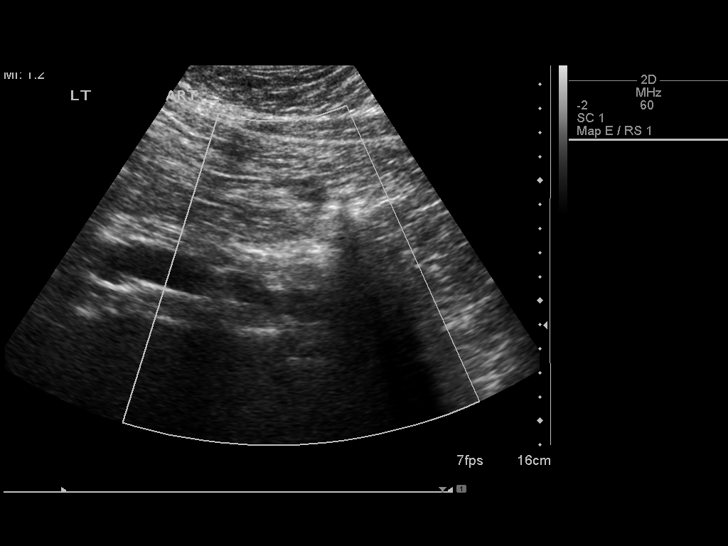
[im 19/26]
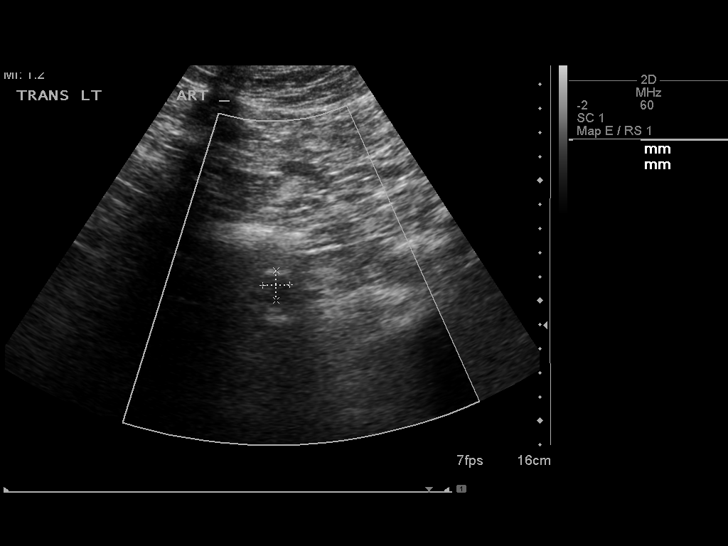
[im 21/26]
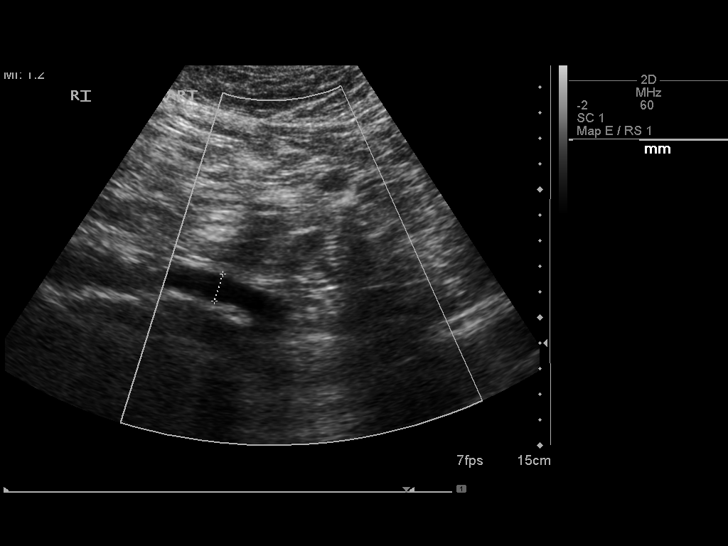
[im 23/26]
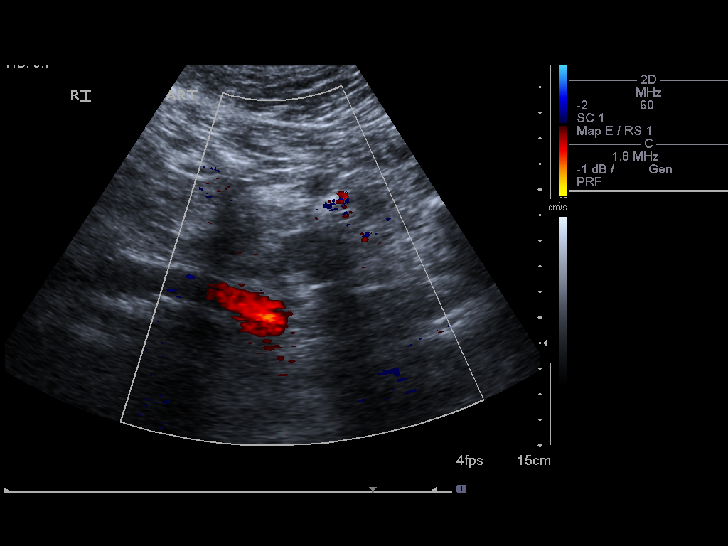
[im 26/26]
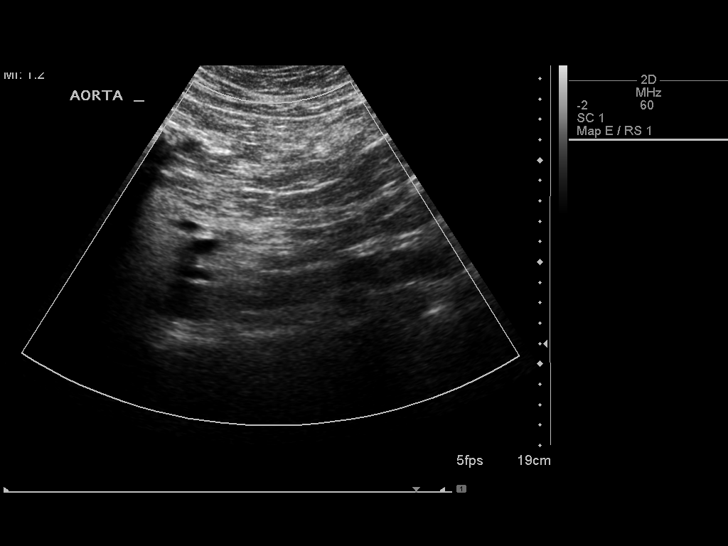

[14 of 25 positions shown; findings below may reference images not displayed]

FINDINGS: Abdominal Aorta

No aneurysm identified.

Maximum AP

Diameter:  2.3 cm

Maximum TRV

Diameter: 2.1 cm in the midportion
IMPRESSION: Negative for abdominal aortic aneurysm.

## 2016-01-02 ENCOUNTER — Encounter: Payer: Self-pay | Admitting: Internal Medicine

## 2016-02-19 DIAGNOSIS — Z1211 Encounter for screening for malignant neoplasm of colon: Secondary | ICD-10-CM | POA: Diagnosis not present

## 2016-02-19 DIAGNOSIS — Z86010 Personal history of colonic polyps: Secondary | ICD-10-CM | POA: Diagnosis not present

## 2016-02-19 DIAGNOSIS — E669 Obesity, unspecified: Secondary | ICD-10-CM | POA: Diagnosis not present

## 2016-02-19 DIAGNOSIS — K5730 Diverticulosis of large intestine without perforation or abscess without bleeding: Secondary | ICD-10-CM | POA: Diagnosis not present

## 2016-03-20 DIAGNOSIS — H524 Presbyopia: Secondary | ICD-10-CM | POA: Diagnosis not present

## 2016-03-20 DIAGNOSIS — H52203 Unspecified astigmatism, bilateral: Secondary | ICD-10-CM | POA: Diagnosis not present

## 2016-03-20 DIAGNOSIS — H2513 Age-related nuclear cataract, bilateral: Secondary | ICD-10-CM | POA: Diagnosis not present

## 2016-03-20 DIAGNOSIS — H5203 Hypermetropia, bilateral: Secondary | ICD-10-CM | POA: Diagnosis not present

## 2016-03-26 DIAGNOSIS — D122 Benign neoplasm of ascending colon: Secondary | ICD-10-CM | POA: Diagnosis not present

## 2016-03-26 DIAGNOSIS — K5730 Diverticulosis of large intestine without perforation or abscess without bleeding: Secondary | ICD-10-CM | POA: Diagnosis not present

## 2016-03-26 DIAGNOSIS — K635 Polyp of colon: Secondary | ICD-10-CM | POA: Diagnosis not present

## 2016-03-26 DIAGNOSIS — Z86010 Personal history of colonic polyps: Secondary | ICD-10-CM | POA: Diagnosis not present

## 2016-03-26 DIAGNOSIS — Z1211 Encounter for screening for malignant neoplasm of colon: Secondary | ICD-10-CM | POA: Diagnosis not present

## 2016-04-02 DIAGNOSIS — S90861A Insect bite (nonvenomous), right foot, initial encounter: Secondary | ICD-10-CM | POA: Diagnosis not present

## 2016-04-02 DIAGNOSIS — Z6832 Body mass index (BMI) 32.0-32.9, adult: Secondary | ICD-10-CM | POA: Diagnosis not present

## 2016-04-02 DIAGNOSIS — W57XXXA Bitten or stung by nonvenomous insect and other nonvenomous arthropods, initial encounter: Secondary | ICD-10-CM | POA: Diagnosis not present

## 2016-05-29 DIAGNOSIS — L821 Other seborrheic keratosis: Secondary | ICD-10-CM | POA: Diagnosis not present

## 2016-05-29 DIAGNOSIS — D225 Melanocytic nevi of trunk: Secondary | ICD-10-CM | POA: Diagnosis not present

## 2016-05-29 DIAGNOSIS — Z85828 Personal history of other malignant neoplasm of skin: Secondary | ICD-10-CM | POA: Diagnosis not present

## 2016-05-29 DIAGNOSIS — B078 Other viral warts: Secondary | ICD-10-CM | POA: Diagnosis not present

## 2016-05-29 DIAGNOSIS — D224 Melanocytic nevi of scalp and neck: Secondary | ICD-10-CM | POA: Diagnosis not present

## 2016-05-29 DIAGNOSIS — L570 Actinic keratosis: Secondary | ICD-10-CM | POA: Diagnosis not present

## 2016-06-11 DIAGNOSIS — I10 Essential (primary) hypertension: Secondary | ICD-10-CM | POA: Diagnosis not present

## 2016-06-11 DIAGNOSIS — E784 Other hyperlipidemia: Secondary | ICD-10-CM | POA: Diagnosis not present

## 2016-06-11 DIAGNOSIS — R7301 Impaired fasting glucose: Secondary | ICD-10-CM | POA: Diagnosis not present

## 2016-06-26 DIAGNOSIS — E669 Obesity, unspecified: Secondary | ICD-10-CM | POA: Diagnosis not present

## 2016-06-26 DIAGNOSIS — R7301 Impaired fasting glucose: Secondary | ICD-10-CM | POA: Diagnosis not present

## 2016-06-26 DIAGNOSIS — M545 Low back pain: Secondary | ICD-10-CM | POA: Diagnosis not present

## 2016-06-26 DIAGNOSIS — Z0000 Encounter for general adult medical examination without abnormal findings: Secondary | ICD-10-CM | POA: Diagnosis not present

## 2016-06-26 DIAGNOSIS — Z1212 Encounter for screening for malignant neoplasm of rectum: Secondary | ICD-10-CM | POA: Diagnosis not present

## 2016-06-26 DIAGNOSIS — N182 Chronic kidney disease, stage 2 (mild): Secondary | ICD-10-CM | POA: Diagnosis not present

## 2016-06-26 DIAGNOSIS — I10 Essential (primary) hypertension: Secondary | ICD-10-CM | POA: Diagnosis not present

## 2016-06-26 DIAGNOSIS — Z1389 Encounter for screening for other disorder: Secondary | ICD-10-CM | POA: Diagnosis not present

## 2016-06-26 DIAGNOSIS — E785 Hyperlipidemia, unspecified: Secondary | ICD-10-CM | POA: Diagnosis not present

## 2016-06-26 DIAGNOSIS — I129 Hypertensive chronic kidney disease with stage 1 through stage 4 chronic kidney disease, or unspecified chronic kidney disease: Secondary | ICD-10-CM | POA: Diagnosis not present

## 2016-06-26 DIAGNOSIS — Z6832 Body mass index (BMI) 32.0-32.9, adult: Secondary | ICD-10-CM | POA: Diagnosis not present

## 2016-06-26 DIAGNOSIS — G4733 Obstructive sleep apnea (adult) (pediatric): Secondary | ICD-10-CM | POA: Diagnosis not present

## 2016-06-26 DIAGNOSIS — Z86010 Personal history of colonic polyps: Secondary | ICD-10-CM | POA: Diagnosis not present

## 2016-08-14 ENCOUNTER — Ambulatory Visit: Payer: Medicare Other | Admitting: Neurology

## 2016-08-15 ENCOUNTER — Encounter: Payer: Self-pay | Admitting: Neurology

## 2016-09-16 ENCOUNTER — Encounter: Payer: Self-pay | Admitting: Neurology

## 2016-09-16 ENCOUNTER — Ambulatory Visit (INDEPENDENT_AMBULATORY_CARE_PROVIDER_SITE_OTHER): Payer: Medicare Other | Admitting: Neurology

## 2016-09-16 VITALS — BP 126/75 | HR 84 | Resp 20 | Ht 71.0 in | Wt 233.0 lb

## 2016-09-16 DIAGNOSIS — G4733 Obstructive sleep apnea (adult) (pediatric): Secondary | ICD-10-CM

## 2016-09-16 DIAGNOSIS — Z9989 Dependence on other enabling machines and devices: Secondary | ICD-10-CM

## 2016-09-16 NOTE — Progress Notes (Signed)
[facility]   Provider:  Larey Seat, M D  Referring Provider: Marton Redwood, MD Primary Care Physician:  Marton Redwood, MD  Chief Complaint  Patient presents with  . Follow-up    cpap, using [facility]    HPI:  Danny Kennedy is a 70 y.o. male  Is seen here for a revisit with compliance of CPAP , in the treatment of sleep apnea.     Consultation note: CD Mr. Danny Kennedy states that he was diagnosed with obstructive sleep apnea is so many years ago he doesn't really know when exactly the diagnosis was made. Dr. Claiborne Rigg ENT- physician had sent him to [facility] hospital about 18 years ago for a sleep study, he was given the CPAP after this evaluation. Dr. Linna Darner  PCP followed him at the time.  He had [employer] as his DME, but he couldn't get any new masks after not having seen a physician in so many years.  He never had physician follow up in regards to CPAP.  The patient has been using an 11 cm water pressure setting as long as he can remember. He feels that the CPAP is working fine for him , but needs his new equipment. He cannot sleep without his machine. He snores loudly without his machine.   Bedtime is 11 Pm and he will fall asleep promptly ( ' in about 2 minutes '), he wakes once a night for a bathroom break and sleeps through until 7 AM, spontaneously, no alarm is needed.  During the day , he drinks diet cokes, no coffee.  ETOH intake is 2 beers a week, and no tobacco use.  He doesn't nap. He does have firearms in the house- [employer], those a locked up.   08-15-15 compliance download;  the patient has used a CPAP 73% with over 4 hours of consecutive nightly use. The average use is 4 hours and 54 minutes. He regularly does not use this CPAP on Sunday nights, but she spends at his [place]. He has another CPAP machine at that location his overall compliance from both machines is well above 90%. His current machine is set between 6 and 11 cm pressure  window was 1 cm expiratory rash or relief or EPR. His [place] machine is set at 11 cm water pressure the 95th percentile pressure for this I will total said machine is 10.9. He is doing well with that and has a residual AHI of only 0.4 - no adjustments have to be made. The patient will receive new supplies,  he actually already [facility].   He reports that one night when he forgot to bring his CPAP with him he felt his airway collapsing and actually cost him to panic. He is a very compliant CPAP user. He had no falls in the last year, he has not had an elevation of depression he keeps an active lifestyle. He likes to deer hunt. Non smoker, Non drinker.  His wife has had recently some health scare and is currently in rehabilitation working on her ambulation and endurance.  Interval history from 09/16/2016, I had the pleasure of seeing Mr. Danny Kennedy today whom I have followed for over 3 years now regularly on CPAP and to has an established diagnosis of obstructive sleep apnea for well over 20 years. He is currently using an AutoSet between 6 and 11 cm water was 1 cm EPR, residual AHI is 0.3. He alternates  between 2 different CPAP machines averaging usually 7 hours and 3 minutes user time each night. I was able to review the compliance record of his normal machines which would give him a compliance of 70% however he is using another machine on weekends at the second home. He endorsed no significant daytime sleepiness, his fatigue severity scale  score was 19 points,  Epworth sleepiness score 3 points and to so-called GE reactive depression score was 1/15 points.  His wife had 4 major surgeries in 4 years and has struggled with health. The couple likes to travel.   Review of Systems:  Out of a complete 14 system review, the patient complains of only the following symptoms, and all other reviewed systems are negative.   He endorsed no significant daytime sleepiness, his  fatigue severity scale  score was 19 points,  Epworth sleepiness score 3 points and to so-called GE reactive depression score was 1/15 points.  No nocturia, no snoring on CPAP.   Social History   Social History  . Marital status: Married    Spouse name: Danny Kennedy  . Number of children: 2  . Years of education: College   Occupational History  . executive   .  Other   Social History Main Topics  . Smoking status: Former Smoker    Quit date: 11/03/1990  . Smokeless tobacco: Former Systems developer    Types: Chew     Comment: Quit 2005  . Alcohol use 0.0 oz/week     Comment: 2 cans beer a month  . Drug use: No  . Sexual activity: Not on file   Other Topics Concern  . Not on file   Social History Narrative   Patient is married Danny Kennedy) and lives at home with his wife.   Patient owns [employer].   Patient has a college education.   Patient has two sons and four grandchildren.   Patient is right-handed.   Patient drinks coffee, Diet cokes about four + a week.                Family History  Problem Relation Age of Onset  . Hypertension Maternal Grandmother   . Stroke Maternal Grandmother 72  . Diabetes Maternal Grandmother   . Heart attack Paternal Uncle     X2, neither pre 55  . Heart attack Maternal Uncle     not pre 55  . Cancer Father     LUNG ,THROAT  . Esophageal cancer Father   . Lung cancer Maternal Grandfather     Past Medical History:  Diagnosis Date  . Alopecia   . Diverticulosis   . Dupuytren contracture    RUE @ thumb  . Gout   . Hyperlipemia    LDL goal = < 100  . Nephrolithiasis [id number]   Dr Serita Butcher, X 2   . OSA on CPAP   . Sleep apnea    CPAP @ 11cm  . Unspecified essential hypertension 05/17/2009    Past Surgical History:  Procedure Laterality Date  . COLONOSCOPY W/ POLYPECTOMY  2005, 2007   x2 negative 10-2007, Dr Sharlett Iles   . renal calculus     retirieved via basket 1984; stone passed spontaneously 2011; Otic tube R TM  .  WISDOM TOOTH EXTRACTION      Current Outpatient Prescriptions  Medication Sig Dispense Refill  . aspirin 81 MG tablet Take 81 mg by mouth daily.      . benazepril (LOTENSIN) 20 MG tablet take  1 tablet by mouth once daily 30 tablet 0  . finasteride (PROSCAR) 5 MG tablet take 1 tablet by mouth once daily as directed 30 tablet 1  . Glucosamine-Chondroit-Vit C-Mn (GLUCOSAMINE 1500 COMPLEX PO) Take 2 tablets by mouth daily.    . multivitamin (THERAGRAN) per tablet Take 1 tablet by mouth daily.      . pravastatin (PRAVACHOL) 40 MG tablet take 1 tablet by mouth at bedtime 90 tablet 1   No current facility-administered medications for this visit.     Allergies as of 09/16/2016  . (No Known Allergies)    Vitals: BP 126/75   Pulse 84   Resp 20   Ht 5\' 11"  (1.803 m)   Wt 233 lb (105.7 kg)   BMI 32.50 kg/m  Last Weight:  Wt Readings from Last 1 Encounters:  09/16/16 233 lb (105.7 kg)   Last Height:   Ht Readings from Last 1 Encounters:  09/16/16 5\' 11"  (1.803 m)    Physical exam:  General: The patient is awake, alert and appears not in acute distress. The patient is well groomed. Head: Normocephalic, atraumatic. Neck is supple. Mallampati 3, very low and narrow airway, neck circumference: 19 inches. Nasal congestions.  Cardiovascular: Regular rate and rhythm, without murmurs or carotid bruit, and without distended neck veins. Respiratory: Lungs are clear to auscultation. Skin:  Without evidence of edema, or rash Trunk: BMI is elevated and patient has normal posture.  Neurologic exam : The patient is awake and alert, oriented to place and time. Memory subjective described as intact.  There is a normal attention span & concentration ability. Speech is fluent without dysarthria, dysphonia or aphasia. Mood and affect are appropriate. Cranial nerves:Pupils are equal and briskly reactive to light. Visual fields by finger perimetry are intact. Hearing to finger rub intact.  Facial  sensation intact to fine touch. Facial motor strength is symmetric and tongue and uvula move midline. Motor exam: Normal tone, muscle bulk and symmetric normal strength in all extremities.  Assessment:  After physical and neurologic examination, review of laboratory studies, imaging, neurophysiology testing and pre-existing records, assessment is   1) Patient with a 20 plus year history of apnea.  Currently happy CPAP user on  11 cm water setting for his older machine at his second home and autoset for the at home machine, 100% -with a residual AHI of 0.3. The patient uses an AutoSet between 6 and 11 cm water was 1 cm EPR. He does have minor air leaks. He does have an older machine at the second home and therefore the compliance is 100% on merged records - from both CPAP's.   Plan:  Treatment plan and additional workup :  1)  Keep using CPAP !   RV in 12 month. Keep up the good compliance.     Mithcell Schumpert, MD   Cc: Dr. Carmie Kanner .

## 2016-12-12 ENCOUNTER — Other Ambulatory Visit: Payer: Self-pay | Admitting: Family Medicine

## 2016-12-12 DIAGNOSIS — N200 Calculus of kidney: Secondary | ICD-10-CM | POA: Diagnosis not present

## 2016-12-12 DIAGNOSIS — I10 Essential (primary) hypertension: Secondary | ICD-10-CM | POA: Diagnosis not present

## 2016-12-12 DIAGNOSIS — I129 Hypertensive chronic kidney disease with stage 1 through stage 4 chronic kidney disease, or unspecified chronic kidney disease: Secondary | ICD-10-CM | POA: Diagnosis not present

## 2016-12-12 DIAGNOSIS — R10817 Generalized abdominal tenderness: Secondary | ICD-10-CM | POA: Diagnosis not present

## 2016-12-12 DIAGNOSIS — N182 Chronic kidney disease, stage 2 (mild): Secondary | ICD-10-CM | POA: Diagnosis not present

## 2016-12-16 ENCOUNTER — Ambulatory Visit
Admission: RE | Admit: 2016-12-16 | Discharge: 2016-12-16 | Disposition: A | Discharge status: Home / Self Care | Payer: Medicare Other | Source: Ambulatory Visit | Attending: Family Medicine | Admitting: Family Medicine

## 2016-12-16 DIAGNOSIS — N200 Calculus of kidney: Secondary | ICD-10-CM

## 2017-01-09 DIAGNOSIS — Z6833 Body mass index (BMI) 33.0-33.9, adult: Secondary | ICD-10-CM | POA: Diagnosis not present

## 2017-01-09 DIAGNOSIS — G5601 Carpal tunnel syndrome, right upper limb: Secondary | ICD-10-CM | POA: Diagnosis not present

## 2017-03-26 DIAGNOSIS — H2513 Age-related nuclear cataract, bilateral: Secondary | ICD-10-CM | POA: Diagnosis not present

## 2017-03-26 DIAGNOSIS — H25013 Cortical age-related cataract, bilateral: Secondary | ICD-10-CM | POA: Diagnosis not present

## 2017-05-28 DIAGNOSIS — L565 Disseminated superficial actinic porokeratosis (DSAP): Secondary | ICD-10-CM | POA: Diagnosis not present

## 2017-05-28 DIAGNOSIS — Z85828 Personal history of other malignant neoplasm of skin: Secondary | ICD-10-CM | POA: Diagnosis not present

## 2017-05-28 DIAGNOSIS — L821 Other seborrheic keratosis: Secondary | ICD-10-CM | POA: Diagnosis not present

## 2017-05-28 DIAGNOSIS — D225 Melanocytic nevi of trunk: Secondary | ICD-10-CM | POA: Diagnosis not present

## 2017-05-28 DIAGNOSIS — D224 Melanocytic nevi of scalp and neck: Secondary | ICD-10-CM | POA: Diagnosis not present

## 2017-05-28 DIAGNOSIS — D1801 Hemangioma of skin and subcutaneous tissue: Secondary | ICD-10-CM | POA: Diagnosis not present

## 2017-06-24 DIAGNOSIS — R7301 Impaired fasting glucose: Secondary | ICD-10-CM | POA: Diagnosis not present

## 2017-06-24 DIAGNOSIS — E784 Other hyperlipidemia: Secondary | ICD-10-CM | POA: Diagnosis not present

## 2017-06-24 DIAGNOSIS — I10 Essential (primary) hypertension: Secondary | ICD-10-CM | POA: Diagnosis not present

## 2017-06-24 DIAGNOSIS — Z125 Encounter for screening for malignant neoplasm of prostate: Secondary | ICD-10-CM | POA: Diagnosis not present

## 2017-07-08 DIAGNOSIS — Z6833 Body mass index (BMI) 33.0-33.9, adult: Secondary | ICD-10-CM | POA: Diagnosis not present

## 2017-07-08 DIAGNOSIS — G629 Polyneuropathy, unspecified: Secondary | ICD-10-CM | POA: Diagnosis not present

## 2017-07-08 DIAGNOSIS — G4733 Obstructive sleep apnea (adult) (pediatric): Secondary | ICD-10-CM | POA: Diagnosis not present

## 2017-07-08 DIAGNOSIS — Z0000 Encounter for general adult medical examination without abnormal findings: Secondary | ICD-10-CM | POA: Diagnosis not present

## 2017-07-08 DIAGNOSIS — R7301 Impaired fasting glucose: Secondary | ICD-10-CM | POA: Diagnosis not present

## 2017-07-08 DIAGNOSIS — N183 Chronic kidney disease, stage 3 (moderate): Secondary | ICD-10-CM | POA: Diagnosis not present

## 2017-07-08 DIAGNOSIS — M159 Polyosteoarthritis, unspecified: Secondary | ICD-10-CM | POA: Diagnosis not present

## 2017-07-08 DIAGNOSIS — Z1389 Encounter for screening for other disorder: Secondary | ICD-10-CM | POA: Diagnosis not present

## 2017-07-08 DIAGNOSIS — I10 Essential (primary) hypertension: Secondary | ICD-10-CM | POA: Diagnosis not present

## 2017-07-08 DIAGNOSIS — E669 Obesity, unspecified: Secondary | ICD-10-CM | POA: Diagnosis not present

## 2017-07-08 DIAGNOSIS — M10079 Idiopathic gout, unspecified ankle and foot: Secondary | ICD-10-CM | POA: Diagnosis not present

## 2017-07-08 DIAGNOSIS — E784 Other hyperlipidemia: Secondary | ICD-10-CM | POA: Diagnosis not present

## 2017-07-10 DIAGNOSIS — M10079 Idiopathic gout, unspecified ankle and foot: Secondary | ICD-10-CM | POA: Diagnosis not present

## 2017-07-13 DIAGNOSIS — Z6832 Body mass index (BMI) 32.0-32.9, adult: Secondary | ICD-10-CM | POA: Diagnosis not present

## 2017-07-13 DIAGNOSIS — M109 Gout, unspecified: Secondary | ICD-10-CM | POA: Diagnosis not present

## 2017-07-17 DIAGNOSIS — Z1212 Encounter for screening for malignant neoplasm of rectum: Secondary | ICD-10-CM | POA: Diagnosis not present

## 2017-09-14 ENCOUNTER — Encounter: Payer: Self-pay | Admitting: Neurology

## 2017-09-16 ENCOUNTER — Encounter (INDEPENDENT_AMBULATORY_CARE_PROVIDER_SITE_OTHER): Payer: Self-pay

## 2017-09-16 ENCOUNTER — Encounter: Payer: Self-pay | Admitting: Neurology

## 2017-09-16 ENCOUNTER — Ambulatory Visit (INDEPENDENT_AMBULATORY_CARE_PROVIDER_SITE_OTHER): Payer: Medicare Other | Admitting: Neurology

## 2017-09-16 VITALS — BP 144/75 | HR 93 | Ht 71.0 in | Wt 237.0 lb

## 2017-09-16 DIAGNOSIS — G4733 Obstructive sleep apnea (adult) (pediatric): Secondary | ICD-10-CM | POA: Diagnosis not present

## 2017-09-16 DIAGNOSIS — Z9989 Dependence on other enabling machines and devices: Secondary | ICD-10-CM | POA: Diagnosis not present

## 2017-09-16 DIAGNOSIS — E669 Obesity, unspecified: Secondary | ICD-10-CM | POA: Diagnosis not present

## 2017-09-16 NOTE — Progress Notes (Signed)
[facility]   Provider:  Larey Seat, M D  Referring Provider: Marton Redwood, MD Primary Care Physician:  Marton Redwood, MD  Chief Complaint  Patient presents with  . Follow-up    pt alone, rm 11, patient states CPAP is working well. DME [facility]    HPI:  Danny Kennedy is a 71 y.o. male  Is seen here for a revisit with compliance of CPAP , in the treatment of sleep apnea.   Interval history from 16 September 2017.  This is a regular routine yearly follow-up for CPAP compliance with Danny Kennedy.  He has been a compliant CPAP user, alternating between CPAP machines and his second home in his primary home.  Together these reports would shortness of a 97% compliance for hours and 100% 4 days.  Average use at time is 7 hours and 5 minutes.  Residual AHI is 0.4 which is an excellent result.  95th percentile pressure is 10.9 cm water, there are no treatment emergent central apneas noted.  There are no changes necessary.  The patient received supplies for his CPAP through [facility]. The  Last prescribed machine is 28.71 years old, the older one is 71 years old, and he is using CPAP for over 20 years.He reports that one night when he forgot to bring his CPAP with him he felt his airway collapsing and actually caused him to panic. He is a very compliant CPAP user.    Consultation note: CD . Danny Kennedy states that he was diagnosed with obstructive sleep apnea is so many years ago he doesn't really know when exactly the diagnosis was made. Dr. Claiborne Rigg ENT- physician had sent him to [facility] about 18 years ago for a sleep study, he was given the CPAP after this evaluation. Dr. Linna Darner  PCP followed him at the time.  He had [facility] as his DME, but he couldn't get any new masks after not having seen a physician in so many years.  He never had physician follow up in regards to CPAP.  The patient has been using an 11 cm water pressure setting as long as he can  remember. He feels that the CPAP is working fine for him , but needs his new equipment. He cannot sleep without his machine. He snores loudly without his machine.   Bedtime is 11 Pm and he will fall asleep promptly ( ' in about 2 minutes '), he wakes once a night for a bathroom break and sleeps through until 7 AM, spontaneously, no alarm is needed.  During the day , he drinks diet cokes, no coffee.  ETOH intake is 2 beers a week, and no tobacco use.  He doesn't nap. He does have firearms in the house- [employer], those a locked up.   08-15-15 compliance download;  the patient has used a CPAP 73% with over 4 hours of consecutive nightly use. The average use is 4 hours and 54 minutes. He regularly does not use this CPAP on Sunday nights, but she spends at his [place]. He has another CPAP machine at that location his overall compliance from both machines is well above 90%. His current machine is set between 6 and 11 cm pressure window was 1 cm expiratory rash or relief or EPR. His [place] machine is set at 11 cm water pressure the 95th percentile pressure for this I will total said machine is 10.9. He is doing well with that and has  a residual AHI of only 0.4 - no adjustments have to be made. The patient will receive new supplies,  he actually already received some last week from [facility].   He had no falls in the last year, he has not had an elevation of depression he keeps an active lifestyle. He likes to deer hunt. Non smoker, Non drinker.  His wife has had recently some health scare and is currently in rehabilitation working on her ambulation and endurance.  Interval history from 09/16/2016, I had the pleasure of seeing Danny Kennedy today whom I have followed for over 3 years now regularly on CPAP and to has an established diagnosis of obstructive sleep apnea for well over 20 years. He is currently using an AutoSet between 6 and 11 cm water was 1 cm EPR, residual AHI is 0.3. He  alternates between 2 different CPAP machines averaging usually 7 hours and 3 minutes user time each night. I was able to review the compliance record of his normal machines which would give him a compliance of 70% however he is using another machine on weekends at the second home. He endorsed no significant daytime sleepiness, his fatigue severity scale  score was 19 points,  Epworth sleepiness score 3 points and to so-called GE reactive depression score was 1/15 points.  His wife had 4 major surgeries in 4 years and has struggled with health. The couple likes to travel.   Review of Systems:  Out of a complete 14 system review, the patient complains of only the following symptoms, and all other reviewed systems are negative.   He endorsed no significant daytime sleepiness, his fatigue severity scale  score was 20 points,  Epworth sleepiness score 2 points and to so-called GE reactive depression score was 1/15 points.  No nocturia, no snoring on CPAP- needs new headgear , tube and clip.    Social History   Socioeconomic History  . Marital status: Married    Spouse name: [name]  . Number of children: 2  . Years of education: College  . Highest education level: Not on file  Social Needs  . Financial resource strain: Not on file  . Food insecurity - worry: Not on file  . Food insecurity - inability: Not on file  . Transportation needs - medical: Not on file  . Transportation needs - non-medical: Not on file  Occupational History  . Occupation: Curator: other  Tobacco Use  . Smoking status: Former Smoker    Last attempt to quit: 11/03/1990    Years since quitting: 26.8  . Smokeless tobacco: Former Systems developer    Types: Chew  . Tobacco comment: Quit 2005  Substance and Sexual Activity  . Alcohol use: Yes    Alcohol/week: 0.0 oz    Comment: 2 cans beer a month  . Drug use: No  . Sexual activity: Not on file  Other Topics Concern  . Not on file  Social History Narrative    Patient is married [name]) and lives at home with his wife.   Patient owns [employer].   Patient has a college education.   Patient has two sons and four grandchildren.   Patient is right-handed.   Patient drinks coffee, Diet cokes about four + a week.                Family History  Problem Relation Age of Onset  . Hypertension Maternal Grandmother   . Stroke Maternal  Grandmother 75  . Diabetes Maternal Grandmother   . Heart attack Paternal Uncle        X2, neither pre 55  . Heart attack Maternal Uncle        not pre 55  . Cancer Father        LUNG ,THROAT  . Esophageal cancer Father   . Lung cancer Maternal Grandfather     Past Medical History:  Diagnosis Date  . Alopecia   . Diverticulosis   . Dupuytren contracture    RUE @ thumb  . Gout   . Hyperlipemia    LDL goal = < 100  . Nephrolithiasis [id number]   Dr Serita Butcher, X 2   . OSA on CPAP   . Sleep apnea    CPAP @ 11cm  . Unspecified essential hypertension 05/17/2009    Past Surgical History:  Procedure Laterality Date  . COLONOSCOPY W/ POLYPECTOMY  2005, 2007   x2 negative 10-2007, Dr Sharlett Iles   . renal calculus     retirieved via basket 1984; stone passed spontaneously 2011; Otic tube R TM  . WISDOM TOOTH EXTRACTION      Current Outpatient Medications  Medication Sig Dispense Refill  . aspirin 81 MG tablet Take 81 mg by mouth daily.      . benazepril (LOTENSIN) 20 MG tablet take 1 tablet by mouth once daily 30 tablet 0  . finasteride (PROSCAR) 5 MG tablet take 1 tablet by mouth once daily as directed 30 tablet 1  . Glucosamine-Chondroit-Vit C-Mn (GLUCOSAMINE 1500 COMPLEX PO) Take 2 tablets by mouth daily.    . multivitamin (THERAGRAN) per tablet Take 1 tablet by mouth daily.      . pravastatin (PRAVACHOL) 40 MG tablet take 1 tablet by mouth at bedtime 90 tablet 1   No current facility-administered medications for this visit.     Allergies as of 09/16/2017  . (No Known Allergies)     Vitals: BP (!) 144/75   Pulse 93   Ht 5\' 11"  (1.803 m)   Wt 237 lb (107.5 kg)   BMI 33.05 kg/m  Last Weight:  Wt Readings from Last 1 Encounters:  09/16/17 237 lb (107.5 kg)   Last Height:   Ht Readings from Last 1 Encounters:  09/16/17 5\' 11"  (1.803 m)    Physical exam:  General: The patient is awake, alert and appears not in acute distress. The patient is well groomed. Head: Normocephalic, atraumatic. Neck is supple. Mallampati 3, very low and narrow airway, neck circumference: 19 inches. Nasal congestions.  Cardiovascular: Regular rate and rhythm, without murmurs or carotid bruit, and without distended neck veins. Respiratory: Lungs are clear to auscultation. Skin:  Without evidence of edema, or rash Trunk: BMI is elevated and patient has normal posture.  Neurologic exam : The patient is awake and alert, oriented to place and time. Memory subjective described as intact.  There is a normal attention span & concentration ability. Speech is fluent without dysarthria, dysphonia or aphasia. Mood and affect are appropriate. Cranial nerves:Pupils are equal and briskly reactive to light. Visual fields by finger perimetry are intact. Hearing to finger rub intact.  Facial sensation intact to fine touch. Facial motor strength is symmetric and tongue and uvula move midline. Motor exam: Normal tone, muscle bulk and symmetric normal strength in all extremities.  Assessment:  After physical and neurologic examination, review of laboratory studies, imaging, neurophysiology testing and pre-existing records, assessment is   1) Patient with a 20 plus year history  of apnea. On CPAP,highly compiant but alternating between 2 machines, one at his [place] .  Currently happy CPAP user on  11 cm water setting for his older machine ( at his second home) and autoset for the at home machine, 100% -with a residual AHI of 0. 4. The patient uses an AutoSet between 6 and 11 cm water was 1 cm EPR. He  does have minor air leaks. He does have an older machine at the second home and therefore the compliance is 100% on merged records - from both CPAP's.   Plan:  Treatment plan and additional workup :  1)  Keep using CPAP !   RV in 12 month. Keep up the good compliance.     Larey Seat, MD   Cc: Dr. Carmie Kanner .

## 2017-11-05 ENCOUNTER — Telehealth: Payer: Self-pay | Admitting: Neurology

## 2017-11-05 NOTE — Telephone Encounter (Signed)
Called the patient there was no answer at this time. I left a message for him to call back. If patient calls back you can advise the patient that the So Clean's are not covered under insurance and have to be paid for out of pocket. Dr Dohmeier doesn't really think highly of them and typically doesn't recommend them. She states that it doesn't clean better then you can. Some patient's have stated that the machine is convenient for them as it takes the workload off of them having to clean it.

## 2017-11-05 NOTE — Telephone Encounter (Signed)
Pt is wanting to discuss the "So Clean" CPAP machines he has seen around. Please call to discuss

## 2017-11-26 ENCOUNTER — Telehealth: Payer: Self-pay | Admitting: Neurology

## 2017-11-26 NOTE — Telephone Encounter (Signed)
Faxed the signed CPAP supply order form to [facility]. Received a receipt of confirmation.

## 2017-11-26 NOTE — Telephone Encounter (Signed)
Received fax from [facility] for CPAP supply order. Ready for MD signature.

## 2017-11-26 NOTE — Telephone Encounter (Signed)
Pt called he needs RX for cpap supplies. Pt said he has called [facility] and they were to fax over the request to [phone]. I gave him the fax# [phone], he will call [facility] and have request faxed to RN.

## 2018-04-01 DIAGNOSIS — H2513 Age-related nuclear cataract, bilateral: Secondary | ICD-10-CM | POA: Diagnosis not present

## 2018-05-05 ENCOUNTER — Inpatient Hospital Stay (HOSPITAL_COMMUNITY)
Admission: EM | Admit: 2018-05-05 | Discharge: 2018-05-08 | DRG: 340 | Disposition: A | Discharge status: Home / Self Care | Payer: Medicare Other | Attending: Surgery | Admitting: Surgery

## 2018-05-05 ENCOUNTER — Encounter (HOSPITAL_COMMUNITY): Admission: EM | Disposition: A | Discharge status: Home / Self Care | Payer: Self-pay | Source: Home / Self Care

## 2018-05-05 ENCOUNTER — Encounter (HOSPITAL_COMMUNITY): Payer: Self-pay | Admitting: Emergency Medicine

## 2018-05-05 ENCOUNTER — Emergency Department (HOSPITAL_COMMUNITY): Payer: Medicare Other | Admitting: Certified Registered Nurse Anesthetist

## 2018-05-05 DIAGNOSIS — G4733 Obstructive sleep apnea (adult) (pediatric): Secondary | ICD-10-CM

## 2018-05-05 DIAGNOSIS — K660 Peritoneal adhesions (postprocedural) (postinfection): Secondary | ICD-10-CM | POA: Diagnosis present

## 2018-05-05 DIAGNOSIS — K5641 Fecal impaction: Secondary | ICD-10-CM | POA: Diagnosis present

## 2018-05-05 DIAGNOSIS — E669 Obesity, unspecified: Secondary | ICD-10-CM | POA: Diagnosis present

## 2018-05-05 DIAGNOSIS — G4730 Sleep apnea, unspecified: Secondary | ICD-10-CM | POA: Diagnosis present

## 2018-05-05 DIAGNOSIS — R1031 Right lower quadrant pain: Secondary | ICD-10-CM | POA: Diagnosis not present

## 2018-05-05 DIAGNOSIS — M109 Gout, unspecified: Secondary | ICD-10-CM | POA: Diagnosis present

## 2018-05-05 DIAGNOSIS — Z8249 Family history of ischemic heart disease and other diseases of the circulatory system: Secondary | ICD-10-CM

## 2018-05-05 DIAGNOSIS — K7689 Other specified diseases of liver: Secondary | ICD-10-CM | POA: Diagnosis not present

## 2018-05-05 DIAGNOSIS — Z87891 Personal history of nicotine dependence: Secondary | ICD-10-CM

## 2018-05-05 DIAGNOSIS — Z7982 Long term (current) use of aspirin: Secondary | ICD-10-CM

## 2018-05-05 DIAGNOSIS — K3580 Unspecified acute appendicitis: Secondary | ICD-10-CM | POA: Diagnosis not present

## 2018-05-05 DIAGNOSIS — K35891 Other acute appendicitis without perforation, with gangrene: Secondary | ICD-10-CM | POA: Diagnosis present

## 2018-05-05 DIAGNOSIS — K3532 Acute appendicitis with perforation and localized peritonitis, without abscess: Principal | ICD-10-CM | POA: Diagnosis present

## 2018-05-05 DIAGNOSIS — E785 Hyperlipidemia, unspecified: Secondary | ICD-10-CM | POA: Diagnosis present

## 2018-05-05 DIAGNOSIS — Z79899 Other long term (current) drug therapy: Secondary | ICD-10-CM

## 2018-05-05 DIAGNOSIS — R1033 Periumbilical pain: Secondary | ICD-10-CM | POA: Diagnosis not present

## 2018-05-05 DIAGNOSIS — I10 Essential (primary) hypertension: Secondary | ICD-10-CM | POA: Diagnosis present

## 2018-05-05 DIAGNOSIS — Z6832 Body mass index (BMI) 32.0-32.9, adult: Secondary | ICD-10-CM | POA: Diagnosis not present

## 2018-05-05 DIAGNOSIS — R109 Unspecified abdominal pain: Secondary | ICD-10-CM | POA: Diagnosis not present

## 2018-05-05 DIAGNOSIS — K828 Other specified diseases of gallbladder: Secondary | ICD-10-CM | POA: Diagnosis not present

## 2018-05-05 HISTORY — PX: LAPAROSCOPIC APPENDECTOMY: SHX408

## 2018-05-05 LAB — CBC WITH DIFFERENTIAL/PLATELET
Basophils Absolute: 0 10*3/uL (ref 0.0–0.1)
Basophils Relative: 0 %
EOS ABS: 0 10*3/uL (ref 0.0–0.7)
Eosinophils Relative: 0 %
HCT: 45.7 % (ref 39.0–52.0)
HEMOGLOBIN: 15.1 g/dL (ref 13.0–17.0)
LYMPHS ABS: 1 10*3/uL (ref 0.7–4.0)
LYMPHS PCT: 7 %
MCH: 30.8 pg (ref 26.0–34.0)
MCHC: 33 g/dL (ref 30.0–36.0)
MCV: 93.1 fL (ref 78.0–100.0)
MONOS PCT: 8 %
Monocytes Absolute: 1.1 10*3/uL — ABNORMAL HIGH (ref 0.1–1.0)
NEUTROS PCT: 85 %
Neutro Abs: 12 10*3/uL — ABNORMAL HIGH (ref 1.7–7.7)
Platelets: 232 10*3/uL (ref 150–400)
RBC: 4.91 MIL/uL (ref 4.22–5.81)
RDW: 14.1 % (ref 11.5–15.5)
WBC: 14.2 10*3/uL — AB (ref 4.0–10.5)

## 2018-05-05 LAB — URINALYSIS, ROUTINE W REFLEX MICROSCOPIC
BACTERIA UA: NONE SEEN
Bilirubin Urine: NEGATIVE
GLUCOSE, UA: NEGATIVE mg/dL
Ketones, ur: NEGATIVE mg/dL
LEUKOCYTES UA: NEGATIVE
NITRITE: NEGATIVE
PH: 5 (ref 5.0–8.0)
Protein, ur: 30 mg/dL — AB

## 2018-05-05 LAB — COMPREHENSIVE METABOLIC PANEL
ALT: 26 U/L (ref 0–44)
ANION GAP: 12 (ref 5–15)
AST: 21 U/L (ref 15–41)
Albumin: 4.2 g/dL (ref 3.5–5.0)
Alkaline Phosphatase: 50 U/L (ref 38–126)
BUN: 18 mg/dL (ref 8–23)
CALCIUM: 9.5 mg/dL (ref 8.9–10.3)
CO2: 27 mmol/L (ref 22–32)
CREATININE: 1.37 mg/dL — AB (ref 0.61–1.24)
Chloride: 98 mmol/L (ref 98–111)
GFR, EST AFRICAN AMERICAN: 58 mL/min — AB (ref >60)
GFR, EST NON AFRICAN AMERICAN: 50 mL/min — AB (ref >60)
Glucose, Bld: 140 mg/dL — ABNORMAL HIGH (ref 70–99)
Potassium: 4.9 mmol/L (ref 3.5–5.1)
SODIUM: 137 mmol/L (ref 135–145)
Total Bilirubin: 1.1 mg/dL (ref 0.3–1.2)
Total Protein: 7.9 g/dL (ref 6.5–8.1)

## 2018-05-05 SURGERY — APPENDECTOMY, LAPAROSCOPIC
Anesthesia: General | Site: Abdomen

## 2018-05-05 MED ORDER — MORPHINE SULFATE (PF) 4 MG/ML IV SOLN
4.0000 mg | Freq: Once | INTRAVENOUS | Status: AC
  Administered 2018-05-05: 4 mg via INTRAVENOUS
  Filled 2018-05-05: qty 1

## 2018-05-05 MED ORDER — ONDANSETRON 4 MG PO TBDP: 4.0000 mg | ORAL_TABLET | Freq: Four times a day (QID) | ORAL | Status: DC | PRN

## 2018-05-05 MED ORDER — BENAZEPRIL HCL 20 MG PO TABS
20.0000 mg | ORAL_TABLET | Freq: Every day | ORAL | Status: DC
  Administered 2018-05-06 – 2018-05-07 (×2): 20 mg via ORAL
  Filled 2018-05-05 (×3): qty 1

## 2018-05-05 MED ORDER — BUPIVACAINE-EPINEPHRINE (PF) 0.25% -1:200000 IJ SOLN
INTRAMUSCULAR | Status: AC
  Filled 2018-05-05: qty 30

## 2018-05-05 MED ORDER — PROPOFOL 10 MG/ML IV BOLUS
INTRAVENOUS | Status: DC | PRN
  Administered 2018-05-05: 180 mg via INTRAVENOUS

## 2018-05-05 MED ORDER — ENOXAPARIN SODIUM 40 MG/0.4ML ~~LOC~~ SOLN
40.0000 mg | Freq: Every day | SUBCUTANEOUS | Status: DC
  Administered 2018-05-06 – 2018-05-07 (×2): 40 mg via SUBCUTANEOUS
  Filled 2018-05-05 (×2): qty 0.4

## 2018-05-05 MED ORDER — LIDOCAINE 2% (20 MG/ML) 5 ML SYRINGE
INTRAMUSCULAR | Status: AC
  Filled 2018-05-05: qty 5

## 2018-05-05 MED ORDER — MEPERIDINE HCL 50 MG/ML IJ SOLN: 6.2500 mg | INTRAMUSCULAR | Status: DC | PRN

## 2018-05-05 MED ORDER — ACETAMINOPHEN 10 MG/ML IV SOLN
INTRAVENOUS | Status: DC | PRN
  Administered 2018-05-05: 1000 mg via INTRAVENOUS

## 2018-05-05 MED ORDER — ROCURONIUM BROMIDE 100 MG/10ML IV SOLN
INTRAVENOUS | Status: AC
  Filled 2018-05-05: qty 1

## 2018-05-05 MED ORDER — SUCCINYLCHOLINE CHLORIDE 200 MG/10ML IV SOSY
PREFILLED_SYRINGE | INTRAVENOUS | Status: AC
  Filled 2018-05-05: qty 10

## 2018-05-05 MED ORDER — DEXAMETHASONE SODIUM PHOSPHATE 10 MG/ML IJ SOLN
INTRAMUSCULAR | Status: DC | PRN
  Administered 2018-05-05: 10 mg via INTRAVENOUS

## 2018-05-05 MED ORDER — ONDANSETRON HCL 4 MG/2ML IJ SOLN
INTRAMUSCULAR | Status: DC | PRN
  Administered 2018-05-05: 4 mg via INTRAVENOUS

## 2018-05-05 MED ORDER — LIDOCAINE 2% (20 MG/ML) 5 ML SYRINGE
INTRAMUSCULAR | Status: DC | PRN
  Administered 2018-05-05: 100 mg via INTRAVENOUS

## 2018-05-05 MED ORDER — LACTATED RINGERS IR SOLN
Status: DC | PRN
  Administered 2018-05-05: 2000 mL

## 2018-05-05 MED ORDER — CELECOXIB 200 MG PO CAPS
200.0000 mg | ORAL_CAPSULE | Freq: Two times a day (BID) | ORAL | Status: DC
  Administered 2018-05-05 – 2018-05-07 (×4): 200 mg via ORAL
  Filled 2018-05-05 (×6): qty 1

## 2018-05-05 MED ORDER — ACETAMINOPHEN 500 MG PO TABS
1000.0000 mg | ORAL_TABLET | Freq: Four times a day (QID) | ORAL | Status: DC
  Administered 2018-05-05 – 2018-05-08 (×10): 1000 mg via ORAL
  Filled 2018-05-05 (×10): qty 2

## 2018-05-05 MED ORDER — PIPERACILLIN-TAZOBACTAM 3.375 G IVPB
3.3750 g | INTRAVENOUS | Status: AC
  Administered 2018-05-05: 3.375 g via INTRAVENOUS

## 2018-05-05 MED ORDER — PROPOFOL 10 MG/ML IV BOLUS
INTRAVENOUS | Status: AC
  Filled 2018-05-05: qty 20

## 2018-05-05 MED ORDER — ONDANSETRON HCL 4 MG/2ML IJ SOLN: 4.0000 mg | Freq: Four times a day (QID) | INTRAMUSCULAR | Status: DC | PRN

## 2018-05-05 MED ORDER — SUGAMMADEX SODIUM 200 MG/2ML IV SOLN
INTRAVENOUS | Status: AC
  Filled 2018-05-05: qty 2

## 2018-05-05 MED ORDER — DEXAMETHASONE SODIUM PHOSPHATE 10 MG/ML IJ SOLN
INTRAMUSCULAR | Status: AC
Start: 2018-05-05 — End: ?
  Filled 2018-05-05: qty 1

## 2018-05-05 MED ORDER — ONDANSETRON HCL 4 MG/2ML IJ SOLN
INTRAMUSCULAR | Status: AC
Start: 2018-05-05 — End: ?
  Filled 2018-05-05: qty 2

## 2018-05-05 MED ORDER — POTASSIUM CHLORIDE IN NACL 20-0.9 MEQ/L-% IV SOLN
INTRAVENOUS | Status: DC
  Administered 2018-05-05 – 2018-05-07 (×4): via INTRAVENOUS
  Filled 2018-05-05 (×6): qty 1000

## 2018-05-05 MED ORDER — SODIUM CHLORIDE 0.9 % IV SOLN
INTRAVENOUS | Status: DC | PRN
  Administered 2018-05-05 (×2): via INTRAVENOUS

## 2018-05-05 MED ORDER — ROCURONIUM BROMIDE 10 MG/ML (PF) SYRINGE
PREFILLED_SYRINGE | INTRAVENOUS | Status: DC | PRN
  Administered 2018-05-05: 10 mg via INTRAVENOUS
  Administered 2018-05-05: 40 mg via INTRAVENOUS

## 2018-05-05 MED ORDER — SUCCINYLCHOLINE CHLORIDE 200 MG/10ML IV SOSY
PREFILLED_SYRINGE | INTRAVENOUS | Status: DC | PRN
  Administered 2018-05-05: 120 mg via INTRAVENOUS

## 2018-05-05 MED ORDER — SODIUM CHLORIDE 0.9 % IV BOLUS
1000.0000 mL | Freq: Once | INTRAVENOUS | Status: AC
  Administered 2018-05-05: 1000 mL via INTRAVENOUS

## 2018-05-05 MED ORDER — FENTANYL CITRATE (PF) 100 MCG/2ML IJ SOLN
INTRAMUSCULAR | Status: DC | PRN
  Administered 2018-05-05 (×2): 100 ug via INTRAVENOUS
  Administered 2018-05-05: 50 ug via INTRAVENOUS

## 2018-05-05 MED ORDER — GABAPENTIN 300 MG PO CAPS
300.0000 mg | ORAL_CAPSULE | Freq: Two times a day (BID) | ORAL | Status: DC
  Administered 2018-05-05 – 2018-05-07 (×4): 300 mg via ORAL
  Filled 2018-05-05 (×8): qty 1

## 2018-05-05 MED ORDER — FENTANYL CITRATE (PF) 100 MCG/2ML IJ SOLN: 25.0000 ug | INTRAMUSCULAR | Status: DC | PRN

## 2018-05-05 MED ORDER — POLYETHYLENE GLYCOL 3350 17 G PO PACK
17.0000 g | PACK | Freq: Every day | ORAL | Status: DC | PRN
  Administered 2018-05-06: 17 g via ORAL
  Filled 2018-05-05: qty 1

## 2018-05-05 MED ORDER — MORPHINE SULFATE (PF) 2 MG/ML IV SOLN: 2.0000 mg | INTRAVENOUS | Status: DC | PRN

## 2018-05-05 MED ORDER — BUPIVACAINE-EPINEPHRINE 0.25% -1:200000 IJ SOLN
INTRAMUSCULAR | Status: DC | PRN
  Administered 2018-05-05: 16 mL

## 2018-05-05 MED ORDER — ACETAMINOPHEN 10 MG/ML IV SOLN
INTRAVENOUS | Status: AC
  Filled 2018-05-05: qty 100

## 2018-05-05 MED ORDER — PHENYLEPHRINE 40 MCG/ML (10ML) SYRINGE FOR IV PUSH (FOR BLOOD PRESSURE SUPPORT)
PREFILLED_SYRINGE | INTRAVENOUS | Status: DC | PRN
  Administered 2018-05-05 (×3): 80 ug via INTRAVENOUS

## 2018-05-05 MED ORDER — ONDANSETRON HCL 4 MG/2ML IJ SOLN: 4.0000 mg | Freq: Once | INTRAMUSCULAR | Status: DC | PRN

## 2018-05-05 MED ORDER — FENTANYL CITRATE (PF) 250 MCG/5ML IJ SOLN
INTRAMUSCULAR | Status: AC
  Filled 2018-05-05: qty 5

## 2018-05-05 MED ORDER — ONDANSETRON HCL 4 MG/2ML IJ SOLN
4.0000 mg | Freq: Once | INTRAMUSCULAR | Status: AC
  Administered 2018-05-05: 4 mg via INTRAVENOUS
  Filled 2018-05-05: qty 2

## 2018-05-05 MED ORDER — PIPERACILLIN-TAZOBACTAM 3.375 G IVPB
3.3750 g | Freq: Three times a day (TID) | INTRAVENOUS | Status: DC
  Administered 2018-05-06 – 2018-05-08 (×7): 3.375 g via INTRAVENOUS
  Filled 2018-05-05 (×10): qty 50

## 2018-05-05 MED ORDER — PRAVASTATIN SODIUM 40 MG PO TABS
40.0000 mg | ORAL_TABLET | Freq: Every day | ORAL | Status: DC
  Administered 2018-05-06 – 2018-05-07 (×2): 40 mg via ORAL
  Filled 2018-05-05 (×3): qty 1

## 2018-05-05 MED ORDER — PIPERACILLIN-TAZOBACTAM 3.375 G IVPB
INTRAVENOUS | Status: AC
  Filled 2018-05-05: qty 50

## 2018-05-05 MED ORDER — PHENYLEPHRINE 40 MCG/ML (10ML) SYRINGE FOR IV PUSH (FOR BLOOD PRESSURE SUPPORT)
PREFILLED_SYRINGE | INTRAVENOUS | Status: AC
  Filled 2018-05-05: qty 10

## 2018-05-05 MED ORDER — OXYCODONE HCL 5 MG PO TABS
5.0000 mg | ORAL_TABLET | ORAL | Status: DC | PRN
  Administered 2018-05-06: 10 mg via ORAL
  Filled 2018-05-05: qty 2

## 2018-05-05 MED ORDER — SUGAMMADEX SODIUM 200 MG/2ML IV SOLN
INTRAVENOUS | Status: DC | PRN
  Administered 2018-05-05: 200 mg via INTRAVENOUS

## 2018-05-05 SURGICAL SUPPLY — 34 items
APPLIER CLIP 5 13 M/L LIGAMAX5 (MISCELLANEOUS)
APPLIER CLIP ROT 10 11.4 M/L (STAPLE)
CABLE HIGH FREQUENCY MONO STRZ (ELECTRODE) IMPLANT
CHLORAPREP W/TINT 26ML (MISCELLANEOUS) ×2 IMPLANT
CLIP APPLIE 5 13 M/L LIGAMAX5 (MISCELLANEOUS) IMPLANT
CLIP APPLIE ROT 10 11.4 M/L (STAPLE) IMPLANT
COVER SURGICAL LIGHT HANDLE (MISCELLANEOUS) ×2 IMPLANT
CUTTER FLEX LINEAR 45M (STAPLE) ×2 IMPLANT
DECANTER SPIKE VIAL GLASS SM (MISCELLANEOUS) ×2 IMPLANT
DERMABOND ADVANCED (GAUZE/BANDAGES/DRESSINGS) ×1
DERMABOND ADVANCED .7 DNX12 (GAUZE/BANDAGES/DRESSINGS) ×1 IMPLANT
DRAPE LAPAROSCOPIC ABDOMINAL (DRAPES) ×2 IMPLANT
ELECT REM PT RETURN 15FT ADLT (MISCELLANEOUS) ×2 IMPLANT
GLOVE BIOGEL PI IND STRL 7.5 (GLOVE) ×1 IMPLANT
GLOVE BIOGEL PI INDICATOR 7.5 (GLOVE) ×1
GLOVE ECLIPSE 7.5 STRL STRAW (GLOVE) ×2 IMPLANT
GOWN STRL REUS W/TWL XL LVL3 (GOWN DISPOSABLE) ×2 IMPLANT
KIT BASIN OR (CUSTOM PROCEDURE TRAY) ×2 IMPLANT
NS IRRIG 1000ML POUR BTL (IV SOLUTION) ×2 IMPLANT
PAD POSITIONING PINK XL (MISCELLANEOUS) ×2 IMPLANT
POUCH SPECIMEN RETRIEVAL 10MM (ENDOMECHANICALS) ×2 IMPLANT
RELOAD 45 VASCULAR/THIN (ENDOMECHANICALS) IMPLANT
RELOAD STAPLE TA45 3.5 REG BLU (ENDOMECHANICALS) ×2 IMPLANT
SCISSORS LAP 5X35 DISP (ENDOMECHANICALS) ×2 IMPLANT
SET IRRIG TUBING LAPAROSCOPIC (IRRIGATION / IRRIGATOR) ×2 IMPLANT
SHEARS HARMONIC ACE PLUS 36CM (ENDOMECHANICALS) ×2 IMPLANT
SLEEVE XCEL OPT CAN 5 100 (ENDOMECHANICALS) ×2 IMPLANT
SUT MNCRL AB 4-0 PS2 18 (SUTURE) ×2 IMPLANT
TOWEL OR 17X26 10 PK STRL BLUE (TOWEL DISPOSABLE) ×2 IMPLANT
TRAY FOLEY MTR SLVR 16FR STAT (SET/KITS/TRAYS/PACK) ×2 IMPLANT
TRAY LAPAROSCOPIC (CUSTOM PROCEDURE TRAY) ×2 IMPLANT
TROCAR BLADELESS OPT 5 100 (ENDOMECHANICALS) ×2 IMPLANT
TROCAR XCEL BLUNT TIP 100MML (ENDOMECHANICALS) ×2 IMPLANT
TUBING INSUF HEATED (TUBING) ×2 IMPLANT

## 2018-05-05 NOTE — ED Notes (Signed)
Pt to the OR via stretcher unable to pull zosyn from [facility]

## 2018-05-05 NOTE — ED Triage Notes (Signed)
Pt sent from doctor's office for appendicitis. Pt has RLQ pain and chills, CT showed appendicitis.

## 2018-05-05 NOTE — H&P (Signed)
Danny Kennedy is an 72 y.o. male.   Chief Complaint: Abdominal pain   HPI: Patient is a pleasant 72 year old male who was in his usual state of health until yesterday when he developed the gradual onset of nausea and vomiting and vague mid abdominal pain.  He had numerous "dry heaves" through the day and evening.  Also had an episode of chills and fever to 101.  Thought he had a stomach bug.  Today the pain became more persistent and severe localized in the lower abdomen, right greater than left.  Continued nausea.  The pain is worse with any motion or coughing.  He saw Dr. Brigitte Pulse and was referred for an outpatient CT scan.  I have reviewed this report which indicates significant acute appendicitis without evidence of abscess or perforation.  Patient was referred for further evaluation and treatment.  Past Medical History:  Diagnosis Date  . Alopecia   . Diverticulosis   . Dupuytren contracture    RUE @ thumb  . Gout   . Hyperlipemia    LDL goal = < 100  . Nephrolithiasis [id number]   Dr Serita Butcher, X 2   . OSA on CPAP   . Sleep apnea    CPAP @ 11cm  . Unspecified essential hypertension 05/17/2009    Past Surgical History:  Procedure Laterality Date  . COLONOSCOPY W/ POLYPECTOMY  2005, 2007   x2 negative 10-2007, Dr Sharlett Iles   . renal calculus     retirieved via basket 1984; stone passed spontaneously 2011; Otic tube R TM  . WISDOM TOOTH EXTRACTION      Family History  Problem Relation Age of Onset  . Cancer Father        LUNG ,THROAT  . Esophageal cancer Father   . Hypertension Maternal Grandmother   . Stroke Maternal Grandmother 72  . Diabetes Maternal Grandmother   . Heart attack Paternal Uncle        X2, neither pre 55  . Heart attack Maternal Uncle        not pre 55  . Lung cancer Maternal Grandfather    Social History:  reports that he quit smoking about 27 years ago. He has quit using smokeless tobacco. His smokeless tobacco use included chew. He reports that he  drinks alcohol. He reports that he does not use drugs.  Allergies: No Known Allergies  Current Facility-Administered Medications  Medication Dose Route Frequency Provider Last Rate Last Dose  . sodium chloride 0.9 % bolus 1,000 mL  1,000 mL Intravenous Once Drenda Freeze, MD 984 mL/hr at 05/05/18 1840 1,000 mL at 05/05/18 1840   Current Outpatient Medications  Medication Sig Dispense Refill  . aspirin 81 MG tablet Take 81 mg by mouth daily.      . benazepril (LOTENSIN) 20 MG tablet take 1 tablet by mouth once daily 30 tablet 0  . finasteride (PROSCAR) 5 MG tablet take 1 tablet by mouth once daily as directed 30 tablet 1  . Glucosamine-Chondroit-Vit C-Mn (GLUCOSAMINE 1500 COMPLEX PO) Take 2 tablets by mouth daily.    . multivitamin (THERAGRAN) per tablet Take 1 tablet by mouth daily.      . pravastatin (PRAVACHOL) 40 MG tablet take 1 tablet by mouth at bedtime 90 tablet 1     Results for orders placed or performed during the hospital encounter of 05/05/18 (from the past 48 hour(s))  CBC with Differential/Platelet     Status: Abnormal   Collection Time: 05/05/18  5:58 PM  Result Value Ref Range   WBC 14.2 (H) 4.0 - 10.5 K/uL   RBC 4.91 4.22 - 5.81 MIL/uL   Hemoglobin 15.1 13.0 - 17.0 g/dL   HCT 45.7 39.0 - 52.0 %   MCV 93.1 78.0 - 100.0 fL   MCH 30.8 26.0 - 34.0 pg   MCHC 33.0 30.0 - 36.0 g/dL   RDW 14.1 11.5 - 15.5 %   Platelets 232 150 - 400 K/uL   Neutrophils Relative % 85 %   Neutro Abs 12.0 (H) 1.7 - 7.7 K/uL   Lymphocytes Relative 7 %   Lymphs Abs 1.0 0.7 - 4.0 K/uL   Monocytes Relative 8 %   Monocytes Absolute 1.1 (H) 0.1 - 1.0 K/uL   Eosinophils Relative 0 %   Eosinophils Absolute 0.0 0.0 - 0.7 K/uL   Basophils Relative 0 %   Basophils Absolute 0.0 0.0 - 0.1 K/uL    Comment: Performed at [facility], [place] [address] [place], [place] [address]  Urinalysis, Routine w reflex microscopic     Status: Abnormal   Collection Time: 05/05/18  6:23 PM   Result Value Ref Range   Color, Urine YELLOW YELLOW   APPearance CLEAR CLEAR   Specific Gravity, Urine >1.046 (H) 1.005 - 1.030   pH 5.0 5.0 - 8.0   Glucose, UA NEGATIVE NEGATIVE mg/dL   Hgb urine dipstick SMALL (A) NEGATIVE   Bilirubin Urine NEGATIVE NEGATIVE   Ketones, ur NEGATIVE NEGATIVE mg/dL   Protein, ur 30 (A) NEGATIVE mg/dL   Nitrite NEGATIVE NEGATIVE   Leukocytes, UA NEGATIVE NEGATIVE   RBC / HPF 0-5 0 - 5 RBC/hpf   WBC, UA 0-5 0 - 5 WBC/hpf   Bacteria, UA NONE SEEN NONE SEEN   Squamous Epithelial / LPF 0-5 0 - 5   Mucus PRESENT     Comment: Performed at [facility], [place] [address] [place], [place] [address]   No results found.  Review of Systems  Constitutional: Positive for chills and fever.  Respiratory: Negative.   Cardiovascular: Negative.   Gastrointestinal: Positive for abdominal pain, nausea and vomiting. Negative for blood in stool, constipation and diarrhea.  Genitourinary: Negative.     Blood pressure 134/90, pulse (!) 113, temperature 99.1 F (37.3 C), resp. rate 18, SpO2 93 %. Physical Exam  General: Alert, mildly obese Caucasian male who appears uncomfortable Skin: Warm and dry without rash or infection. HEENT: No palpable masses or thyromegaly. Sclera nonicteric. Pupils equal round and reactive. Oropharynx clear. Lymph nodes: No cervical, supraclavicular, or inguinal nodes palpable. Lungs: Breath sounds clear and equal without increased work of breathing Cardiovascular: Regular rate and rhythm without murmur. No JVD or edema. Peripheral pulses intact. Abdomen: Nondistended.  Localized marked right lower quadrant tenderness with guarding.  Remainder of abdomen relatively nontender.  No masses palpable. No organomegaly. No palpable hernias. Extremities: No edema or joint swelling or deformity. No chronic venous stasis changes. Neurologic: Alert and fully oriented.  Affect normal.  No gross motor  deficits.   Assessment/Plan Acute appendicitis.  No evidence of rupture or abscess by CT but he appears somewhat ill with leukocytosis and marked tenderness.  Patient is receiving IV fluids and broad-spectrum IV antibiotics.  I recommended proceeding with emergency laparoscopic and possible open appendectomy.  I discussed the nature of the illness and indications and nature of surgery.  Discussed alternatives and risks of surgery including anesthetic complications, bleeding, infection, visceral injury or possible need for open procedure.  All  questions were answered and he and his family desire to proceed with appendectomy.  Edward Jolly, MD 05/05/2018, 6:50 PM

## 2018-05-05 NOTE — Anesthesia Postprocedure Evaluation (Signed)
Anesthesia Post Note  Patient: Danny Kennedy  Procedure(s) Performed: APPENDECTOMY LAPAROSCOPIC (N/A Abdomen)     Patient location during evaluation: PACU Anesthesia Type: General Level of consciousness: awake and alert and oriented Pain management: pain level controlled Vital Signs Assessment: post-procedure vital signs reviewed and stable Respiratory status: spontaneous breathing, nonlabored ventilation, respiratory function stable and patient connected to nasal cannula oxygen Cardiovascular status: blood pressure returned to baseline and stable Postop Assessment: no apparent nausea or vomiting Anesthetic complications: no    Last Vitals:  Vitals:   05/05/18 2240 05/05/18 2245  BP:  114/70  Pulse: (!) 106 (!) 103  Resp: 20 (!) 23  Temp:  37.3 C  SpO2: 91% 92%    Last Pain:  Vitals:   05/05/18 2230  PainSc: 0-No pain                 Woodroe Vogan A.

## 2018-05-05 NOTE — ED Provider Notes (Signed)
[facility] DEPT Provider Note   CSN: [id number] Arrival date & time: 05/05/18  1736     History   Chief Complaint Chief Complaint  Patient presents with  . Abdominal Pain    HPI Danny Kennedy is a 72 y.o. male hx of OSA, HL, here presenting with abdominal pain.  Patient had periumbilical pain since yesterday.  Started progressively radiating down to the right lower quadrant.  Patient states that his pain is crampy and associated with some nausea.  Patient also had a fever 101 around 11:00 this morning.  Patient went to see his doctor and was sent for CT abdomen pelvis.  CT abdomen pelvis showed acute appendicitis with no rupture.  Patient's last meal was this morning.  Patient sent here for surgical management.  The history is provided by the patient.    Past Medical History:  Diagnosis Date  . Alopecia   . Diverticulosis   . Dupuytren contracture    RUE @ thumb  . Gout   . Hyperlipemia    LDL goal = < 100  . Nephrolithiasis [phone]   Dr Serita Butcher, X 2   . OSA on CPAP   . Sleep apnea    CPAP @ 11cm  . Unspecified essential hypertension 05/17/2009    Patient Active Problem List   Diagnosis Date Noted  . Obesity (BMI 30-39.9) 09/16/2017  . Other seasonal allergic rhinitis 08/15/2015  . OSA on CPAP 07/25/2014  . UARS (upper airway resistance syndrome) 07/25/2014  . Lumbar radiculopathy 10/26/2013  . Non-allergic rhinitis 10/26/2013  . Squamous cell cancer of external ear 04/15/2013  . Hx of adenomatous colonic polyps 09/18/2011  . NEPHROLITHIASIS, HX OF 10/01/2010  . UNSPECIFIED ESSENTIAL HYPERTENSION 05/17/2009  . DIVERTICULOSIS, COLON 05/17/2009  . FASTING HYPERGLYCEMIA 05/17/2009  . GOUT 08/22/2008  . HYPERLIPIDEMIA 04/05/2008  . DUPUYTREN'S CONTRACTURE 04/08/2007  . SLEEP APNEA 04/08/2007    Past Surgical History:  Procedure Laterality Date  . COLONOSCOPY W/ POLYPECTOMY  2005, 2007   x2 negative 10-2007, Dr Sharlett Iles     . renal calculus     retirieved via basket 1984; stone passed spontaneously 2011; Otic tube R TM  . WISDOM TOOTH EXTRACTION          Home Medications    Prior to Admission medications   Medication Sig Start Date End Date Taking? Authorizing Provider  aspirin 81 MG tablet Take 81 mg by mouth daily.      [provider]  benazepril (LOTENSIN) 20 MG tablet take 1 tablet by mouth once daily 11/15/12   Hendricks Limes, MD  finasteride (PROSCAR) 5 MG tablet take 1 tablet by mouth once daily as directed    Hendricks Limes, MD  Glucosamine-Chondroit-Vit C-Mn (GLUCOSAMINE 1500 COMPLEX PO) Take 2 tablets by mouth daily.    [provider]  multivitamin [facility]) per tablet Take 1 tablet by mouth daily.      [provider]  pravastatin (PRAVACHOL) 40 MG tablet take 1 tablet by mouth at bedtime 03/21/14   Hendricks Limes, MD    Family History Family History  Problem Relation Age of Onset  . Cancer Father        LUNG ,THROAT  . Esophageal cancer Father   . Hypertension Maternal Grandmother   . Stroke Maternal Grandmother 72  . Diabetes Maternal Grandmother   . Heart attack Paternal Uncle        X2, neither pre 55  . Heart attack Maternal Uncle  not pre 55  . Lung cancer Maternal Grandfather     Social History Social History   Tobacco Use  . Smoking status: Former Smoker    Last attempt to quit: 11/03/1990    Years since quitting: 27.5  . Smokeless tobacco: Former Systems developer    Types: Chew  . Tobacco comment: Quit 2005  Substance Use Topics  . Alcohol use: Yes    Comment: 2 cans beer a month  . Drug use: No     Allergies   Patient has no known allergies.   Review of Systems Review of Systems  Gastrointestinal: Positive for abdominal pain.  All other systems reviewed and are negative.    Physical Exam Updated Vital Signs BP 134/90 (BP Location: Right Arm)   Pulse (!) 113   Temp 99.1 F (37.3 C)   Resp 18   SpO2 93%   Physical  Exam  Constitutional:  Uncomfortable   HENT:  Head: Normocephalic.  Mouth/Throat: Oropharynx is clear and moist.  Eyes: Pupils are equal, round, and reactive to light. EOM are normal.  Cardiovascular: Normal rate, regular rhythm and normal heart sounds.  Pulmonary/Chest: Effort normal and breath sounds normal.  Abdominal:  + periumbilical and RLQ tenderness with mild rebound and guarding   Neurological: He is alert.  Skin: Skin is warm. Capillary refill takes less than 2 seconds.  Psychiatric: He has a normal mood and affect.  Nursing note and vitals reviewed.    ED Treatments / Results  Labs (all labs ordered are listed, but only abnormal results are displayed) Labs Reviewed  CBC WITH DIFFERENTIAL/PLATELET - Abnormal; Notable for the following components:      Result Value   WBC 14.2 (*)    Neutro Abs 12.0 (*)    Monocytes Absolute 1.1 (*)    All other components within normal limits  URINALYSIS, ROUTINE W REFLEX MICROSCOPIC - Abnormal; Notable for the following components:   Specific Gravity, Urine >1.046 (*)    Hgb urine dipstick SMALL (*)    Protein, ur 30 (*)    All other components within normal limits  COMPREHENSIVE METABOLIC PANEL    EKG None  Radiology No results found.  Procedures Procedures (including critical care time)  Medications Ordered in ED Medications  sodium chloride 0.9 % bolus 1,000 mL (1,000 mLs Intravenous New Bag/Given 05/05/18 1840)  morphine 4 MG/ML injection 4 mg (4 mg Intravenous Given 05/05/18 1831)  ondansetron (ZOFRAN) injection 4 mg (4 mg Intravenous Given 05/05/18 1831)     Initial Impression / Assessment and Plan / ED Course  I have reviewed the triage vital signs and the nursing notes.  Pertinent labs & imaging results that were available during my care of the patient were reviewed by me and considered in my medical decision making (see chart for details).     Danny Kennedy is a 72 y.o. male here with RLQ pain. Outpatient  CT showed acute appendicitis with no rupture. Will get preop labs, consult surgery. NPO since this morning.   6:52 PM WBC 14. Dr. Excell Seltzer from surgery will perform appendectomy. Stable to transfer up to OR and be admitted by surgery postop   Final Clinical Impressions(s) / ED Diagnoses   Final diagnoses:  Acute appendicitis, unspecified acute appendicitis type    ED Discharge Orders    None       Drenda Freeze, MD 05/05/18 [phone]

## 2018-05-05 NOTE — Transfer of Care (Signed)
Immediate Anesthesia Transfer of Care Note  Patient: Danny Kennedy  Procedure(s) Performed: APPENDECTOMY LAPAROSCOPIC (N/A Abdomen)  Patient Location: PACU  Anesthesia Type:General  Level of Consciousness: awake, alert  and oriented  Airway & Oxygen Therapy: Patient Spontanous Breathing and Patient connected to face mask oxygen  Post-op Assessment: Report given to RN and Post -op Vital signs reviewed and stable  Post vital signs: Reviewed and stable  Last Vitals:  Vitals Value Taken Time  BP 129/81 05/05/2018  9:38 PM  Temp    Pulse 125 05/05/2018  9:39 PM  Resp 28 05/05/2018  9:39 PM  SpO2 89 % 05/05/2018  9:39 PM  Vitals shown include unvalidated device data.  Last Pain:  Vitals:   05/05/18 1746  PainSc: 5          Complications: No apparent anesthesia complications

## 2018-05-05 NOTE — Anesthesia Procedure Notes (Signed)
Procedure Name: Intubation Date/Time: 05/05/2018 8:16 PM Performed by: Trystin Terhune D, CRNA Pre-anesthesia Checklist: Patient identified, Emergency Drugs available, Suction available and Patient being monitored Patient Re-evaluated:Patient Re-evaluated prior to induction Oxygen Delivery Method: Circle system utilized Preoxygenation: Pre-oxygenation with 100% oxygen Induction Type: IV induction and Rapid sequence Grade View: Grade III Tube type: Oral Tube size: 7.5 mm Number of attempts: 2 Airway Equipment and Method: Stylet Placement Confirmation: ETT inserted through vocal cords under direct vision,  positive ETCO2 and breath sounds checked- equal and bilateral Tube secured with: Tape Dental Injury: Teeth and Oropharynx as per pre-operative assessment

## 2018-05-05 NOTE — Op Note (Signed)
Preoperative Diagnosis: Acute appendicitis, unspecified acute appendicitis type [K35.80]  Postoprative Diagnosis: Acute appendicitis with gangrene and perforation  Procedure: Procedure(s): APPENDECTOMY LAPAROSCOPIC   Surgeon: Excell Seltzer T   Assistants: None  Anesthesia:  General endotracheal anesthesia  Indications: Patient is a 72 year old male who presents with 48 hours of progressive lower abdominal pain, nausea and vomiting and fever.  He has leukocytosis and marked right lower quadrant tenderness.  CT scan has shown evidence of acute appendicitis without apparent perforation or abscess.  We have recommended proceeding with emergency laparoscopic appendectomy.  The procedure and risks were discussed in detail documented elsewhere and he agrees to proceed.    Procedure Detail: Patient received preoperative IV antibiotics.  He is brought to the operating room, placed in the supine position on the operating table, and general endotracheal anesthesia induced.  PAS were in place.  The abdomen was widely sterilely prepped and draped.  Patient timeout was performed and correct procedure verified.  Access was obtained with a 1/2 cm incision at the umbilicus with an open Hassan technique through mattress suture of 0 Vicryl and pneumoperitoneum established.  Under direct vision 5 mm trochars were placed in the upper midline and left lower quadrant.  There were noted to be inflammatory adhesions of the terminal ileum and cecum to the lateral pelvic sidewall.  These were carefully taken down with blunt dissection and a gangrenous perforated appendix was exposed.  This had been well walled off and there was no large abscess.  Feculent material was suctioned until it was decompressed.  There were dense inflammatory adhesions to the lateral pelvic sidewall which were taken down with mostly blunt dissection.  The tip of the cecum and base of the appendix were identified.  For mobilization the cecum and  terminal ileum were mobilized from lateral peritoneal attachments further exposing the appendix.  Using blunt dissection to mobilize away from inflammatory attachments posteriorly and laterally and the harmonic scalpel to sequentially divided the mesoappendix the appendix was freed up to its base and the tip of the cecum which was mildly inflamed but clearly viable.  The appendix was divided across the tip of the cecum with a single firing of the blue load 35 mm stapler.  The staple line was intact and without bleeding.  The appendix and a fecalith were removed in an Endo Catch bag through the umbilical incision.  The operative site and right abdomen were then copiously irrigated with several liters of saline.  Hemostasis was assured.  There was no evidence of injury or bleeding or other problems.  All CO2 was evacuated and the mattress suture secured to the umbilicus.  Skin incisions were closed with some particular Monocryl and Dermabond.  Sponge needle and instrument counts were correct.    Findings: Acute appendicitis with gangrene and perforation with localized peritonitis  Estimated Blood Loss:  less than 50 mL         Drains: None  Blood Given: none          Specimens: Appendix        Complications:  * No complications entered in OR log *         Disposition: PACU - hemodynamically stable.         Condition: stable

## 2018-05-05 NOTE — Anesthesia Preprocedure Evaluation (Addendum)
Anesthesia Evaluation  Patient identified by MRN, date of birth, ID band Patient awake    Reviewed: Allergy & Precautions, NPO status , Patient's Chart, lab work & pertinent test results  Airway Mallampati: III  TM Distance: >3 FB Neck ROM: Full    Dental no notable dental hx. (+) Teeth Intact   Pulmonary sleep apnea and Continuous Positive Airway Pressure Ventilation , former smoker,    Pulmonary exam normal breath sounds clear to auscultation       Cardiovascular hypertension, Pt. on medications Normal cardiovascular exam Rhythm:Regular Rate:Normal     Neuro/Psych  Neuromuscular disease negative psych ROS   GI/Hepatic Neg liver ROS, Acute appendicitis   Endo/Other  Hyperlipidemia Obesity  Renal/GU Renal diseaseHx/o nephrolithiasis   BPH    Musculoskeletal DePyutren's contracture right thumb   Abdominal (+) + obese,  Abdomen: tender.    Peds  Hematology negative hematology ROS (+)   Anesthesia Other Findings   Reproductive/Obstetrics                             Anesthesia Physical Anesthesia Plan  ASA: III and emergent  Anesthesia Plan: General   Post-op Pain Management:    Induction: Intravenous, Rapid sequence and Cricoid pressure planned  PONV Risk Score and Plan: 4 or greater and Ondansetron, Dexamethasone and Treatment may vary due to age or medical condition  Airway Management Planned: Oral ETT  Additional Equipment:   Intra-op Plan:   Post-operative Plan: Extubation in OR  Informed Consent: I have reviewed the patients History and Physical, chart, labs and discussed the procedure including the risks, benefits and alternatives for the proposed anesthesia with the patient or authorized representative who has indicated his/her understanding and acceptance.   Dental advisory given  Plan Discussed with: CRNA and Surgeon  Anesthesia Plan Comments:        Anesthesia Quick Evaluation

## 2018-05-06 ENCOUNTER — Encounter (HOSPITAL_COMMUNITY): Payer: Self-pay | Admitting: General Surgery

## 2018-05-06 ENCOUNTER — Other Ambulatory Visit: Payer: Self-pay

## 2018-05-06 LAB — BASIC METABOLIC PANEL
ANION GAP: 10 (ref 5–15)
BUN: 24 mg/dL — ABNORMAL HIGH (ref 8–23)
CHLORIDE: 105 mmol/L (ref 98–111)
CO2: 23 mmol/L (ref 22–32)
CREATININE: 1.7 mg/dL — AB (ref 0.61–1.24)
Calcium: 8.2 mg/dL — ABNORMAL LOW (ref 8.9–10.3)
GFR calc non Af Amer: 39 mL/min — ABNORMAL LOW (ref >60)
GFR, EST AFRICAN AMERICAN: 45 mL/min — AB (ref >60)
Glucose, Bld: 167 mg/dL — ABNORMAL HIGH (ref 70–99)
Potassium: 5 mmol/L (ref 3.5–5.1)
SODIUM: 138 mmol/L (ref 135–145)

## 2018-05-06 LAB — CBC
HCT: 43.3 % (ref 39.0–52.0)
HEMOGLOBIN: 13.9 g/dL (ref 13.0–17.0)
MCH: 30.4 pg (ref 26.0–34.0)
MCHC: 32.1 g/dL (ref 30.0–36.0)
MCV: 94.7 fL (ref 78.0–100.0)
Platelets: 217 10*3/uL (ref 150–400)
RBC: 4.57 MIL/uL (ref 4.22–5.81)
RDW: 14.5 % (ref 11.5–15.5)
WBC: 14.8 10*3/uL — AB (ref 4.0–10.5)

## 2018-05-06 MED ORDER — PHENOL 1.4 % MT LIQD
1.0000 | OROMUCOSAL | Status: DC | PRN
  Filled 2018-05-06: qty 177

## 2018-05-06 NOTE — Progress Notes (Addendum)
Spoke with pt regarding use of cpap tonight.  Pt stated that he does wear cpap at home but does not want to wear it tonight.  Pt prefers to wear nasal cannula instead, currently at 3lpm.  Pt was encouraged to call, should he change his mind.  RN aware.

## 2018-05-06 NOTE — Progress Notes (Signed)
Pt. seen for CPAP, is currently on room air per home regimen, humidifier refilled with s/w, RT set up at bedside for pt. and states," able to place on self when ready, made aware to notify if needed.

## 2018-05-06 NOTE — Progress Notes (Signed)
   General Surgery [facility]  Assessment & Plan: POD#1 status post lap appendectomy for gangrenous appendicitis  Continue IV abx  Advance diet as tolerated  OOB, ambulate in halls  Plan oral abx for 1 week upon discharge home        Earnstine Regal, MD, [facility]       Office: [phone]    Chief Complaint: Acute gangrenous appendicitis  Subjective: Patient in bed, more comfortable.  Wants a "Kris[employer]reme doughnut"  Objective: Vital signs in last 24 hours: Temp:  [99.1 F (37.3 C)-103.1 F (39.5 C)] 99.1 F (37.3 C) (07/04 0439) Pulse Rate:  [69-133] 69 (07/04 0439) Resp:  [18-28] 18 (07/04 0439) BP: (92-136)/(56-90) 92/63 (07/04 0439) SpO2:  [91 %-96 %] 96 % (07/04 0439) Weight:  [104.7 kg (230 lb 13.2 oz)-107.5 kg (237 lb)] 104.7 kg (230 lb 13.2 oz) (07/03 2300) Last BM Date: 05/04/18  Intake/Output from previous day: 07/03 0701 - 07/04 0700 In: 2566.7 [I.V.:2566.7] Out: 725 [Urine:700; Blood:25] Intake/Output this shift: No intake/output data recorded.  Physical Exam: HEENT - sclerae clear, mucous membranes moist Neck - soft Chest - clear bilaterally Cor - RRR Abdomen - wounds dry and intact; soft; mild RLQ tenderness Ext - no edema, non-tender Neuro - alert & oriented, no focal deficits  Lab Results:  Recent Labs    05/05/18 1758 05/06/18 0558  WBC 14.2* 14.8*  HGB 15.1 13.9  HCT 45.7 43.3  PLT 232 217   BMET Recent Labs    05/05/18 1758 05/06/18 0558  NA 137 138  K 4.9 5.0  CL 98 105  CO2 27 23  GLUCOSE 140* 167*  BUN 18 24*  CREATININE 1.37* 1.70*  CALCIUM 9.5 8.2*   PT/INR No results for input(s): LABPROT, INR in the last 72 hours. Comprehensive Metabolic Panel:    Component Value Date/Time   NA 138 05/06/2018 0558   NA 137 05/05/2018 1758   K 5.0 05/06/2018 0558   K 4.9 05/05/2018 1758   CL 105 05/06/2018 0558   CL 98 05/05/2018 1758   CO2 23 05/06/2018 0558   CO2 27  05/05/2018 1758   BUN 24 (H) 05/06/2018 0558   BUN 18 05/05/2018 1758   CREATININE 1.70 (H) 05/06/2018 0558   CREATININE 1.37 (H) 05/05/2018 1758   GLUCOSE 167 (H) 05/06/2018 0558   GLUCOSE 140 (H) 05/05/2018 1758   CALCIUM 8.2 (L) 05/06/2018 0558   CALCIUM 9.5 05/05/2018 1758   AST 21 05/05/2018 1758   AST 21 02/21/2013 0838   ALT 26 05/05/2018 1758   ALT 29 02/21/2013 0838   ALKPHOS 50 05/05/2018 1758   ALKPHOS 45 02/21/2013 0838   BILITOT 1.1 05/05/2018 1758   BILITOT 0.5 02/21/2013 0838   PROT 7.9 05/05/2018 1758   PROT 7.0 02/21/2013 0838   ALBUMIN 4.2 05/05/2018 1758   ALBUMIN 4.1 02/21/2013 0838    Studies/Results: No results found.    Danny Kennedy 05/06/2018  Patient ID: Danny Kennedy, male   DOB: 03-Dec-1945, 72 y.o.   MRN: [id number]

## 2018-05-07 LAB — CBC
HEMATOCRIT: 36.9 % — AB (ref 39.0–52.0)
Hemoglobin: 11.8 g/dL — ABNORMAL LOW (ref 13.0–17.0)
MCH: 30.6 pg (ref 26.0–34.0)
MCHC: 32 g/dL (ref 30.0–36.0)
MCV: 95.6 fL (ref 78.0–100.0)
Platelets: 195 10*3/uL (ref 150–400)
RBC: 3.86 MIL/uL — ABNORMAL LOW (ref 4.22–5.81)
RDW: 14.6 % (ref 11.5–15.5)
WBC: 11.7 10*3/uL — ABNORMAL HIGH (ref 4.0–10.5)

## 2018-05-07 LAB — BASIC METABOLIC PANEL
ANION GAP: 7 (ref 5–15)
BUN: 40 mg/dL — ABNORMAL HIGH (ref 8–23)
CHLORIDE: 107 mmol/L (ref 98–111)
CO2: 25 mmol/L (ref 22–32)
Calcium: 8 mg/dL — ABNORMAL LOW (ref 8.9–10.3)
Creatinine, Ser: 1.76 mg/dL — ABNORMAL HIGH (ref 0.61–1.24)
GFR calc non Af Amer: 37 mL/min — ABNORMAL LOW (ref >60)
GFR, EST AFRICAN AMERICAN: 43 mL/min — AB (ref >60)
GLUCOSE: 104 mg/dL — AB (ref 70–99)
POTASSIUM: 4.9 mmol/L (ref 3.5–5.1)
SODIUM: 139 mmol/L (ref 135–145)

## 2018-05-07 MED ORDER — SODIUM CHLORIDE 0.9 % IV SOLN
INTRAVENOUS | Status: DC
  Administered 2018-05-07: 07:00:00 via INTRAVENOUS

## 2018-05-07 NOTE — Progress Notes (Signed)
2 Days Post-Op   Subjective/Chief Complaint: No complaints other than soreness   Objective: Vital signs in last 24 hours: Temp:  [97.6 F (36.4 C)-99.3 F (37.4 C)] 97.7 F (36.5 C) (07/05 0542) Pulse Rate:  [69-91] 83 (07/05 0542) Resp:  [17-19] 19 (07/05 0542) BP: (92-117)/(52-77) 117/77 (07/05 0542) SpO2:  [85 %-95 %] 95 % (07/05 0542) Last BM Date: 05/04/18  Intake/Output from previous day: 07/04 0701 - 07/05 0700 In: 1590 [P.O.:240; I.V.:1200; IV Piggyback:150] Out: -  Intake/Output this shift: Total I/O In: 360 [P.O.:360] Out: -   General appearance: alert and cooperative Resp: clear to auscultation bilaterally Cardio: regular rate and rhythm GI: soft, moderate tenderness. good bs  Lab Results:  Recent Labs    05/06/18 0558 05/07/18 0552  WBC 14.8* 11.7*  HGB 13.9 11.8*  HCT 43.3 36.9*  PLT 217 195   BMET Recent Labs    05/06/18 0558 05/07/18 0552  NA 138 139  K 5.0 4.9  CL 105 107  CO2 23 25  GLUCOSE 167* 104*  BUN 24* 40*  CREATININE 1.70* 1.76*  CALCIUM 8.2* 8.0*   PT/INR No results for input(s): LABPROT, INR in the last 72 hours. ABG No results for input(s): PHART, HCO3 in the last 72 hours.  Invalid input(s): PCO2, PO2  Studies/Results: No results found.  Anti-infectives: Anti-infectives (From admission, onward)   Start     Dose/Rate Route Frequency Ordered Stop   05/06/18 0400  piperacillin-tazobactam (ZOSYN) IVPB 3.375 g     3.375 g 12.5 mL/hr over 240 Minutes Intravenous Every 8 hours 05/05/18 2305     05/05/18 2033  piperacillin-tazobactam (ZOSYN) 3.375 GM/50ML IVPB    Note to Pharmacy:  Zigmund Daniel   : cabinet override      05/05/18 2033 05/05/18 2034   05/05/18 1900  piperacillin-tazobactam (ZOSYN) IVPB 3.375 g     3.375 g 12.5 mL/hr over 240 Minutes Intravenous NOW 05/05/18 1855 05/05/18 2034      Assessment/Plan: s/p Procedure(s): APPENDECTOMY LAPAROSCOPIC (N/A) Advance diet  Cr up slightly. Will continue  hydration and recheck tomorrow. If cr is back down then will plan for discharge tomorrow Continue IV abx and switch to orals before discharge  LOS: 2 days    TOTH III,PAUL S 05/07/2018

## 2018-05-07 NOTE — Discharge Instructions (Signed)
Laparoscopic Appendectomy, Adult, Care After °These instructions give you information about caring for yourself after your procedure. Your doctor may also give you more specific instructions. Call your doctor if you have any problems or questions after your procedure. °Follow these instructions at home: °Medicines °· Take over-the-counter and prescription medicines only as told by your doctor. °· Do not drive for 24 hours if you received a sedative. °· Do not drive or use heavy machinery while taking prescription pain medicine. °· If you were prescribed an antibiotic medicine, take it as told by your doctor. Do not stop taking it even if you start to feel better. °Activity °· Do not lift anything that is heavier than 10 pounds (4.5 kg) for 3 weeks or as told by your doctor. °· Do not play contact sports for 3 weeks or as told by your doctor. °· Slowly return to your normal activities. °Bathing °· Keep your cuts from surgery (incisions) clean and dry. °? Gently wash the cuts with soap and water. °? Rinse the cuts with water until the soap is gone. °? Pat the cuts dry with a clean towel. Do not rub the cuts. °· You may take showers after 48 hours. °· Do not take baths, swim, or use a hot tub for 2 weeks or as told by your doctor. °Cut Care °· Follow instructions from your doctor about how to take care of your cuts. Make sure you: °? Wash your hands with soap and water before you change your bandage (dressing). If you do not have soap and water, use hand sanitizer. °? Change your bandage as told by your doctor. °? Leave stitches (sutures), skin glue, or skin tape (adhesive) strips in place. They may need to stay in place for 2 weeks or longer. If tape strips get loose and curl up, you may trim the loose edges. Do not remove tape strips completely unless your doctor says it is okay. °· Check your cuts every day for signs of infection. Check for: °? More redness, swelling, or pain. °? More fluid or  blood. °? Warmth. °? Pus or a bad smell. °Other Instructions °· If you were sent home with a drain, follow instructions from your doctor about how to use it and care for it. °· Take deep breaths. This helps to keep your lungs from getting swollen (inflamed). °· To help with constipation: °? Drink plenty of fluids. °? Eat plenty of fruits and vegetables. °· Keep all follow-up visits as told by your doctor. This is important. °Contact a doctor if: °· You have more redness, swelling, or pain around a cut from surgery. °· You have more fluid or blood coming from a cut. °· Your cut feels warm to the touch. °· You have pus or a bad smell coming from a cut or a bandage. °· The edges of a cut break open after the stitches have been taken out. °· You have pain in your shoulders that gets worse. °· You feel dizzy or you pass out (faint). °· You have shortness of breath. °· You keep feeling sick to your stomach (nauseous). °· You keep throwing up (vomiting). °· You get diarrhea or you cannot control your poop. °· You lose your appetite. °· You have swelling or pain in your legs. °Get help right away if: °· You have a fever. °· You get a rash. °· You have trouble breathing. °· You have sharp pains in your chest. °This information is not intended to replace advice given   to you by your health care provider. Make sure you discuss any questions you have with your health care provider. °Document Released: 08/16/2009 Document Revised: 03/27/2016 Document Reviewed: 04/09/2015 °Elsevier Interactive Patient Education © 2018 [employer] ° °CCS ______[facility] °LAPAROSCOPIC SURGERY: POST OP INSTRUCTIONS °Always review your discharge instruction sheet given to you by the facility where your surgery was performed. °IF YOU HAVE DISABILITY OR FAMILY LEAVE FORMS, YOU MUST BRING THEM TO THE OFFICE FOR PROCESSING.   °DO NOT GIVE THEM TO YOUR DOCTOR. ° °1. A prescription for pain medication may be given to you upon  discharge.  Take your pain medication as prescribed, if needed.  If narcotic pain medicine is not needed, then you may take acetaminophen (Tylenol) or ibuprofen (Advil) as needed. °2. Take your usually prescribed medications unless otherwise directed. °3. If you need a refill on your pain medication, please contact your pharmacy.  They will contact our office to request authorization. Prescriptions will not be filled after 5pm or on week-ends. °4. You should follow a light diet the first few days after arrival home, such as soup and crackers, etc.  Be sure to include lots of fluids daily. °5. Most patients will experience some swelling and bruising in the area of the incisions.  Ice packs will help.  Swelling and bruising can take several days to resolve.  °6. It is common to experience some constipation if taking pain medication after surgery.  Increasing fluid intake and taking a stool softener (such as Colace) will usually help or prevent this problem from occurring.  A mild laxative (Milk of Magnesia or Miralax) should be taken according to package instructions if there are no bowel movements after 48 hours. °7. Unless discharge instructions indicate otherwise, you may remove your bandages 24-48 hours after surgery, and you may shower at that time.  You may have steri-strips (small skin tapes) in place directly over the incision.  These strips should be left on the skin for 7-10 days.  If your surgeon used skin glue on the incision, you may shower in 24 hours.  The glue will flake off over the next 2-3 weeks.  Any sutures or staples will be removed at the office during your follow-up visit. °8. ACTIVITIES:  You may resume regular (light) daily activities beginning the next day--such as daily self-care, walking, climbing stairs--gradually increasing activities as tolerated.  You may have sexual intercourse when it is comfortable.  Refrain from any heavy lifting or straining until approved by your doctor. °a. You  may drive when you are no longer taking prescription pain medication, you can comfortably wear a seatbelt, and you can safely maneuver your car and apply brakes. °b. RETURN TO WORK:  __________________________________________________________ °9. You should see your doctor in the office for a follow-up appointment approximately 2-3 weeks after your surgery.  Make sure that you call for this appointment within a day or two after you arrive home to insure a convenient appointment time. °10. OTHER INSTRUCTIONS: __________________________________________________________________________________________________________________________ __________________________________________________________________________________________________________________________ °WHEN TO CALL YOUR DOCTOR: °1. Fever over 101.0 °2. Inability to urinate °3. Continued bleeding from incision. °4. Increased pain, redness, or drainage from the incision. °5. Increasing abdominal pain ° °The clinic staff is available to answer your questions during regular business hours.  Please don’t hesitate to call and ask to speak to one of the nurses for clinical concerns.  If you have a medical emergency, go to the nearest emergency room or call 911.  A surgeon from   [facility] is always on call at the hospital. °[address], [address], [place], [place]  [address] ? [address] [address], [place], [place]   [address] °[phone] ? [phone] ? FAX [phone] °Web site: [email or web address] ° °

## 2018-05-08 LAB — BASIC METABOLIC PANEL
ANION GAP: 6 (ref 5–15)
BUN: 36 mg/dL — ABNORMAL HIGH (ref 8–23)
CHLORIDE: 109 mmol/L (ref 98–111)
CO2: 25 mmol/L (ref 22–32)
Calcium: 8.1 mg/dL — ABNORMAL LOW (ref 8.9–10.3)
Creatinine, Ser: 1.59 mg/dL — ABNORMAL HIGH (ref 0.61–1.24)
GFR calc Af Amer: 49 mL/min — ABNORMAL LOW (ref >60)
GFR calc non Af Amer: 42 mL/min — ABNORMAL LOW (ref >60)
GLUCOSE: 94 mg/dL (ref 70–99)
Potassium: 4.9 mmol/L (ref 3.5–5.1)
Sodium: 140 mmol/L (ref 135–145)

## 2018-05-08 LAB — CBC WITH DIFFERENTIAL/PLATELET
BASOS ABS: 0 10*3/uL (ref 0.0–0.1)
Basophils Relative: 0 %
Eosinophils Absolute: 0.2 10*3/uL (ref 0.0–0.7)
Eosinophils Relative: 3 %
HEMATOCRIT: 36.1 % — AB (ref 39.0–52.0)
Hemoglobin: 11.4 g/dL — ABNORMAL LOW (ref 13.0–17.0)
LYMPHS PCT: 22 %
Lymphs Abs: 1.6 10*3/uL (ref 0.7–4.0)
MCH: 30.2 pg (ref 26.0–34.0)
MCHC: 31.6 g/dL (ref 30.0–36.0)
MCV: 95.8 fL (ref 78.0–100.0)
MONO ABS: 0.4 10*3/uL (ref 0.1–1.0)
MONOS PCT: 6 %
NEUTROS ABS: 5.1 10*3/uL (ref 1.7–7.7)
Neutrophils Relative %: 69 %
Platelets: 221 10*3/uL (ref 150–400)
RBC: 3.77 MIL/uL — ABNORMAL LOW (ref 4.22–5.81)
RDW: 14.6 % (ref 11.5–15.5)
WBC: 7.4 10*3/uL (ref 4.0–10.5)

## 2018-05-08 MED ORDER — ONDANSETRON 4 MG PO TBDP: 4.0000 mg | ORAL_TABLET | Freq: Four times a day (QID) | ORAL | 0 refills | Status: DC | PRN

## 2018-05-08 MED ORDER — OXYCODONE HCL 5 MG PO TABS: 5.0000 mg | ORAL_TABLET | ORAL | 0 refills | Status: DC | PRN

## 2018-05-08 NOTE — Progress Notes (Signed)
Danny Kennedy to be D/C'd Home per MD order.  Discussed prescriptions and follow up appointments with the patient. Prescriptions given to patient, medication list explained in detail. Pt verbalized understanding.  Allergies as of 05/08/2018   No Known Allergies     Medication List    TAKE these medications   acetaminophen 500 MG tablet Commonly known as:  TYLENOL Take 1,000 mg by mouth daily as needed for headache.   benazepril 20 MG tablet Commonly known as:  LOTENSIN take 1 tablet by mouth once daily   FIBER COMPLETE Tabs Take 1 tablet by mouth daily as needed (CONSTIPATION).   finasteride 5 MG tablet Commonly known as:  PROSCAR take 1 tablet by mouth once daily as directed   GLUCOSAMINE 1500 COMPLEX PO Take 2 tablets by mouth daily.   multivitamin per tablet Take 1 tablet by mouth daily.   ondansetron 4 MG disintegrating tablet Commonly known as:  ZOFRAN-ODT Take 1 tablet (4 mg total) by mouth every 6 (six) hours as needed for nausea.   oxyCODONE 5 MG immediate release tablet Commonly known as:  Oxy IR/ROXICODONE Take 1-2 tablets (5-10 mg total) by mouth every 4 (four) hours as needed for moderate pain.   pravastatin 40 MG tablet Commonly known as:  PRAVACHOL take 1 tablet by mouth at bedtime       Vitals:   05/07/18 2049 05/08/18 0548  BP: 101/67 124/80  Pulse: 92 75  Resp: 17 17  Temp: 98.8 F (37.1 C) 98.2 F (36.8 C)  SpO2: 95% 98%    Skin clean, dry and intact without evidence of skin break down, no evidence of skin tears noted. IV catheter discontinued intact. Site without signs and symptoms of complications. Dressing and pressure applied. Pt denies pain at this time. No complaints noted.  An After Visit Summary was printed and given to the patient. Patient escorted via [place], and D/C home via private auto.  Haywood Lasso BSN, RN [employer] Phone [phone]

## 2018-05-08 NOTE — Discharge Summary (Signed)
Physician Discharge Summary  Patient ID: Danny Kennedy MRN: [id number] DOB/AGE: 1946-02-04 72 y.o.  PCP: Marton Redwood, MD  Admit date: 05/05/2018 Discharge date: 05/08/2018  Admission Diagnoses:  appendicitis  Discharge Diagnoses:  Gangrenous appendicitis  Principal Problem:   Acute gangrenous appendicitis s/p lap appendectomy 05/05/2018 Active Problems:   GOUT   Essential hypertension   OSA on CPAP   Obesity (BMI 30-39.9)   Surgery:  Lap appendectomy  Discharged Condition: improved  Hospital Course:   Had surgery on July 3.  Bowel function returned and was ready for discharge on Saturday, July 6th.  Doing well  Consults: none  Significant Diagnostic Studies: WBC today down to normal    Discharge Exam: Blood pressure 124/80, pulse 75, temperature 98.2 F (36.8 C), temperature source Oral, resp. rate 17, height 5\' 11"  (1.803 m), weight 104.7 kg (230 lb 13.2 oz), SpO2 98 %. Incisions are bland with Dermabond  Disposition: Discharge disposition: 01-Home or Self Care       Discharge Instructions    Call MD for:  redness, tenderness, or signs of infection (pain, swelling, redness, odor or green/yellow discharge around incision site)   Complete by:  As directed    Call MD for:  severe uncontrolled pain   Complete by:  As directed    Diet - low sodium heart healthy   Complete by:  As directed    Increase activity slowly   Complete by:  As directed      Allergies as of 05/08/2018   No Known Allergies     Medication List    TAKE these medications   acetaminophen 500 MG tablet Commonly known as:  TYLENOL Take 1,000 mg by mouth daily as needed for headache.   benazepril 20 MG tablet Commonly known as:  LOTENSIN take 1 tablet by mouth once daily   FIBER COMPLETE Tabs Take 1 tablet by mouth daily as needed (CONSTIPATION).   finasteride 5 MG tablet Commonly known as:  PROSCAR take 1 tablet by mouth once daily as directed   GLUCOSAMINE 1500 COMPLEX PO Take  2 tablets by mouth daily.   multivitamin per tablet Take 1 tablet by mouth daily.   ondansetron 4 MG disintegrating tablet Commonly known as:  ZOFRAN-ODT Take 1 tablet (4 mg total) by mouth every 6 (six) hours as needed for nausea.   oxyCODONE 5 MG immediate release tablet Commonly known as:  Oxy IR/ROXICODONE Take 1-2 tablets (5-10 mg total) by mouth every 4 (four) hours as needed for moderate pain.   pravastatin 40 MG tablet Commonly known as:  PRAVACHOL take 1 tablet by mouth at bedtime      Follow-up Information    [facility] Follow up.   Specialty:  General Surgery Why:  The office will call you Monday with follow up appointment. Contact information: [address] [place] [address] [phone]           Signed: Pedro Earls 05/08/2018, 8:31 AM

## 2018-05-08 NOTE — Care Management Important Message (Signed)
Important Message  Patient Details  Name: Aarik Blank MRN: [id number] Date of Birth: 1946-10-18   Medicare Important Message Given:  Yes    Erenest Rasher, RN 05/08/2018, 10:44 AM

## 2018-05-14 ENCOUNTER — Inpatient Hospital Stay (HOSPITAL_COMMUNITY)
Admission: AD | Admit: 2018-05-14 | Discharge: 2018-05-15 | DRG: 863 | Disposition: A | Discharge status: Home / Self Care | Payer: Medicare Other | Source: Ambulatory Visit | Attending: Surgery | Admitting: Surgery

## 2018-05-14 ENCOUNTER — Encounter (HOSPITAL_COMMUNITY): Payer: Self-pay

## 2018-05-14 ENCOUNTER — Other Ambulatory Visit: Payer: Self-pay

## 2018-05-14 ENCOUNTER — Ambulatory Visit
Admission: RE | Admit: 2018-05-14 | Discharge: 2018-05-14 | Disposition: A | Discharge status: Home / Self Care | Payer: Medicare Other | Source: Ambulatory Visit | Attending: Surgery | Admitting: Surgery

## 2018-05-14 ENCOUNTER — Other Ambulatory Visit: Payer: Self-pay | Admitting: Surgery

## 2018-05-14 ENCOUNTER — Inpatient Hospital Stay (HOSPITAL_COMMUNITY): Payer: Medicare Other

## 2018-05-14 DIAGNOSIS — K4090 Unilateral inguinal hernia, without obstruction or gangrene, not specified as recurrent: Secondary | ICD-10-CM | POA: Diagnosis not present

## 2018-05-14 DIAGNOSIS — G4733 Obstructive sleep apnea (adult) (pediatric): Secondary | ICD-10-CM | POA: Diagnosis present

## 2018-05-14 DIAGNOSIS — T8143XA Infection following a procedure, organ and space surgical site, initial encounter: Secondary | ICD-10-CM | POA: Diagnosis present

## 2018-05-14 DIAGNOSIS — Z87891 Personal history of nicotine dependence: Secondary | ICD-10-CM

## 2018-05-14 DIAGNOSIS — I10 Essential (primary) hypertension: Secondary | ICD-10-CM | POA: Diagnosis present

## 2018-05-14 DIAGNOSIS — M109 Gout, unspecified: Secondary | ICD-10-CM | POA: Diagnosis present

## 2018-05-14 DIAGNOSIS — T8149XA Infection following a procedure, other surgical site, initial encounter: Secondary | ICD-10-CM

## 2018-05-14 DIAGNOSIS — Y836 Removal of other organ (partial) (total) as the cause of abnormal reaction of the patient, or of later complication, without mention of misadventure at the time of the procedure: Secondary | ICD-10-CM | POA: Diagnosis not present

## 2018-05-14 DIAGNOSIS — E785 Hyperlipidemia, unspecified: Secondary | ICD-10-CM | POA: Diagnosis present

## 2018-05-14 DIAGNOSIS — K35891 Other acute appendicitis without perforation, with gangrene: Secondary | ICD-10-CM | POA: Diagnosis present

## 2018-05-14 DIAGNOSIS — Z9049 Acquired absence of other specified parts of digestive tract: Secondary | ICD-10-CM

## 2018-05-14 DIAGNOSIS — K651 Peritoneal abscess: Secondary | ICD-10-CM

## 2018-05-14 DIAGNOSIS — R509 Fever, unspecified: Secondary | ICD-10-CM | POA: Diagnosis not present

## 2018-05-14 LAB — COMPREHENSIVE METABOLIC PANEL
ALBUMIN: 3.7 g/dL (ref 3.5–5.0)
ALT: 21 U/L (ref 0–44)
AST: 15 U/L (ref 15–41)
Alkaline Phosphatase: 53 U/L (ref 38–126)
Anion gap: 10 (ref 5–15)
BILIRUBIN TOTAL: 0.6 mg/dL (ref 0.3–1.2)
BUN: 30 mg/dL — ABNORMAL HIGH (ref 8–23)
CO2: 28 mmol/L (ref 22–32)
Calcium: 9.4 mg/dL (ref 8.9–10.3)
Chloride: 102 mmol/L (ref 98–111)
Creatinine, Ser: 1.5 mg/dL — ABNORMAL HIGH (ref 0.61–1.24)
GFR, EST AFRICAN AMERICAN: 52 mL/min — AB (ref >60)
GFR, EST NON AFRICAN AMERICAN: 45 mL/min — AB (ref >60)
GLUCOSE: 103 mg/dL — AB (ref 70–99)
POTASSIUM: 4.9 mmol/L (ref 3.5–5.1)
Sodium: 140 mmol/L (ref 135–145)
TOTAL PROTEIN: 7.8 g/dL (ref 6.5–8.1)

## 2018-05-14 LAB — CBC WITH DIFFERENTIAL/PLATELET
BASOS ABS: 0 10*3/uL (ref 0.0–0.1)
Basophils Relative: 0 %
Eosinophils Absolute: 0.2 10*3/uL (ref 0.0–0.7)
Eosinophils Relative: 1 %
HEMATOCRIT: 42.7 % (ref 39.0–52.0)
Hemoglobin: 13.6 g/dL (ref 13.0–17.0)
LYMPHS PCT: 15 %
Lymphs Abs: 2.2 10*3/uL (ref 0.7–4.0)
MCH: 30.1 pg (ref 26.0–34.0)
MCHC: 31.9 g/dL (ref 30.0–36.0)
MCV: 94.5 fL (ref 78.0–100.0)
Monocytes Absolute: 0.9 10*3/uL (ref 0.1–1.0)
Monocytes Relative: 6 %
NEUTROS ABS: 11.2 10*3/uL — AB (ref 1.7–7.7)
NEUTROS PCT: 78 %
Platelets: 419 10*3/uL — ABNORMAL HIGH (ref 150–400)
RBC: 4.52 MIL/uL (ref 4.22–5.81)
RDW: 14.2 % (ref 11.5–15.5)
WBC: 14.4 10*3/uL — AB (ref 4.0–10.5)

## 2018-05-14 LAB — PROTIME-INR
INR: 1.06
Prothrombin Time: 13.7 seconds (ref 11.4–15.2)

## 2018-05-14 MED ORDER — PRAVASTATIN SODIUM 20 MG PO TABS
40.0000 mg | ORAL_TABLET | Freq: Every day | ORAL | Status: DC
  Administered 2018-05-14 – 2018-05-15 (×2): 40 mg via ORAL
  Filled 2018-05-14 (×2): qty 2

## 2018-05-14 MED ORDER — NALOXONE HCL 0.4 MG/ML IJ SOLN
INTRAMUSCULAR | Status: AC
  Filled 2018-05-14: qty 1

## 2018-05-14 MED ORDER — POTASSIUM CHLORIDE IN NACL 20-0.45 MEQ/L-% IV SOLN
INTRAVENOUS | Status: DC
  Administered 2018-05-14: 16:00:00 via INTRAVENOUS
  Filled 2018-05-14 (×2): qty 1000

## 2018-05-14 MED ORDER — FINASTERIDE 5 MG PO TABS
2.5000 mg | ORAL_TABLET | ORAL | Status: DC
  Filled 2018-05-14: qty 0.5

## 2018-05-14 MED ORDER — SODIUM CHLORIDE 0.9% FLUSH
5.0000 mL | Freq: Three times a day (TID) | INTRAVENOUS | Status: DC
  Administered 2018-05-14: 5 mL

## 2018-05-14 MED ORDER — MIDAZOLAM HCL 2 MG/2ML IJ SOLN
INTRAMUSCULAR | Status: AC
  Filled 2018-05-14: qty 4

## 2018-05-14 MED ORDER — ENOXAPARIN SODIUM 40 MG/0.4ML ~~LOC~~ SOLN
40.0000 mg | SUBCUTANEOUS | Status: DC
  Administered 2018-05-15: 40 mg via SUBCUTANEOUS
  Filled 2018-05-14: qty 0.4

## 2018-05-14 MED ORDER — METOPROLOL TARTRATE 5 MG/5ML IV SOLN: 5.0000 mg | Freq: Four times a day (QID) | INTRAVENOUS | Status: DC | PRN

## 2018-05-14 MED ORDER — FENTANYL CITRATE (PF) 100 MCG/2ML IJ SOLN
INTRAMUSCULAR | Status: AC | PRN
  Administered 2018-05-14 (×2): 50 ug via INTRAVENOUS

## 2018-05-14 MED ORDER — ACETAMINOPHEN 650 MG RE SUPP
650.0000 mg | Freq: Four times a day (QID) | RECTAL | Status: DC | PRN
Start: 2018-05-14 — End: 2018-05-15

## 2018-05-14 MED ORDER — FLUMAZENIL 0.5 MG/5ML IV SOLN
INTRAVENOUS | Status: AC
  Filled 2018-05-14: qty 5

## 2018-05-14 MED ORDER — DOCUSATE SODIUM 100 MG PO CAPS
100.0000 mg | ORAL_CAPSULE | Freq: Two times a day (BID) | ORAL | Status: DC
  Administered 2018-05-14: 100 mg via ORAL
  Filled 2018-05-14 (×2): qty 1

## 2018-05-14 MED ORDER — ONDANSETRON HCL 4 MG/2ML IJ SOLN: 4.0000 mg | Freq: Four times a day (QID) | INTRAMUSCULAR | Status: DC | PRN

## 2018-05-14 MED ORDER — COLCHICINE 0.6 MG PO TABS
0.6000 mg | ORAL_TABLET | Freq: Two times a day (BID) | ORAL | Status: DC | PRN
  Administered 2018-05-14 – 2018-05-15 (×3): 0.6 mg via ORAL
  Filled 2018-05-14 (×3): qty 1

## 2018-05-14 MED ORDER — ONDANSETRON 4 MG PO TBDP: 4.0000 mg | ORAL_TABLET | Freq: Four times a day (QID) | ORAL | Status: DC | PRN

## 2018-05-14 MED ORDER — PIPERACILLIN-TAZOBACTAM 3.375 G IVPB
INTRAVENOUS | Status: AC
  Administered 2018-05-14: 3.375 g via INTRAVENOUS
  Filled 2018-05-14: qty 50

## 2018-05-14 MED ORDER — IOPAMIDOL (ISOVUE-300) INJECTION 61%
50.0000 mL | Freq: Once | INTRAVENOUS | Status: AC | PRN
  Administered 2018-05-14: 50 mL via INTRAVENOUS

## 2018-05-14 MED ORDER — PANTOPRAZOLE SODIUM 40 MG PO TBEC
40.0000 mg | DELAYED_RELEASE_TABLET | Freq: Every day | ORAL | Status: DC
  Administered 2018-05-14 – 2018-05-15 (×2): 40 mg via ORAL
  Filled 2018-05-14 (×2): qty 1

## 2018-05-14 MED ORDER — TRAMADOL HCL 50 MG PO TABS
50.0000 mg | ORAL_TABLET | Freq: Four times a day (QID) | ORAL | Status: DC | PRN
  Administered 2018-05-14: 50 mg via ORAL
  Filled 2018-05-14: qty 1

## 2018-05-14 MED ORDER — SODIUM CHLORIDE 0.9 % IV SOLN
INTRAVENOUS | Status: AC
  Filled 2018-05-14: qty 250

## 2018-05-14 MED ORDER — MORPHINE SULFATE (PF) 2 MG/ML IV SOLN: 2.0000 mg | INTRAVENOUS | Status: DC | PRN

## 2018-05-14 MED ORDER — PIPERACILLIN-TAZOBACTAM 3.375 G IVPB
3.3750 g | Freq: Three times a day (TID) | INTRAVENOUS | Status: DC
  Administered 2018-05-14 – 2018-05-15 (×3): 3.375 g via INTRAVENOUS
  Filled 2018-05-14: qty 50

## 2018-05-14 MED ORDER — ACETAMINOPHEN 325 MG PO TABS
650.0000 mg | ORAL_TABLET | Freq: Four times a day (QID) | ORAL | Status: DC | PRN
  Administered 2018-05-14 – 2018-05-15 (×2): 650 mg via ORAL
  Filled 2018-05-14 (×2): qty 2

## 2018-05-14 MED ORDER — BENAZEPRIL HCL 10 MG PO TABS
20.0000 mg | ORAL_TABLET | Freq: Every day | ORAL | Status: DC
  Administered 2018-05-14 – 2018-05-15 (×2): 20 mg via ORAL
  Filled 2018-05-14 (×2): qty 2

## 2018-05-14 MED ORDER — MIDAZOLAM HCL 2 MG/2ML IJ SOLN
INTRAMUSCULAR | Status: AC | PRN
  Administered 2018-05-14 (×2): 1 mg via INTRAVENOUS

## 2018-05-14 MED ORDER — FENTANYL CITRATE (PF) 100 MCG/2ML IJ SOLN
INTRAMUSCULAR | Status: AC
  Filled 2018-05-14: qty 4

## 2018-05-14 NOTE — Progress Notes (Signed)
Patient asking for his gout medication ---is colchicine 0.6mg  po TID PRN. Dr Donne Hazel paged to request it. Donne Hazel, RN

## 2018-05-14 NOTE — H&P (Signed)
[facility] Admission Note  Danny Kennedy [birth year]  [id number].    Chief Complaint/Reason for Consult: low grade fevers  HPI:  Patient is a 72 year old male s/p lap appendectomy 05/05/18 by Dr. Excell Seltzer, was discharged from the hospital 05/08/18. Since discharge has experienced low grade fevers. He called our office and was sent for a CT scan earlier today. CT scan showed 7 cm abscess in the RLQ. Patient was advised to go to [facility] for direct admission. He denies abdominal pain, nausea, diarrhea, constipation, urinary symptoms, chest pain or SOB. PMH significant for HTN, HLD and OSA on CPAP. He reports occasional alcohol use, and denies tobacco or illicit drug use. His wife was present at the bedside.   ROS: Review of Systems  Constitutional: Positive for fever. Negative for chills.  Respiratory: Negative for shortness of breath and wheezing.   Cardiovascular: Negative for chest pain and palpitations.  Gastrointestinal: Negative for abdominal pain, constipation, diarrhea, nausea and vomiting.  Genitourinary: Negative for dysuria, frequency and urgency.  All other systems reviewed and are negative.   Family History  Problem Relation Age of Onset  . Cancer Father        LUNG ,THROAT  . Esophageal cancer Father   . Hypertension Maternal Grandmother   . Stroke Maternal Grandmother 72  . Diabetes Maternal Grandmother   . Heart attack Paternal Uncle        X2, neither pre 55  . Heart attack Maternal Uncle        not pre 55  . Lung cancer Maternal Grandfather     Past Medical History:  Diagnosis Date  . Alopecia   . Diverticulosis   . Dupuytren contracture    RUE @ thumb  . Gout   . Hyperlipemia    LDL goal = < 100  . Nephrolithiasis [id number]   Dr Serita Butcher, X 2   . OSA on CPAP   . Sleep apnea    CPAP @ 11cm  . Unspecified essential hypertension 05/17/2009    Past Surgical History:  Procedure Laterality Date  . COLONOSCOPY W/ POLYPECTOMY   2005, 2007   x2 negative 10-2007, Dr Sharlett Iles   . LAPAROSCOPIC APPENDECTOMY N/A 05/05/2018   Procedure: APPENDECTOMY LAPAROSCOPIC;  Surgeon: Excell Seltzer, MD;  Location: [facility]S;  Service: General;  Laterality: N/A;  . renal calculus     retirieved via basket 1984; stone passed spontaneously 2011; Otic tube R TM  . WISDOM TOOTH EXTRACTION      Social History:  reports that he quit smoking about 27 years ago. He has quit using smokeless tobacco. His smokeless tobacco use included chew. He reports that he drinks alcohol. He reports that he does not use drugs.  Allergies: No Known Allergies  Medications Prior to Admission  Medication Sig Dispense Refill  . acetaminophen (TYLENOL) 500 MG tablet Take 1,000 mg by mouth daily as needed for headache.    . benazepril (LOTENSIN) 20 MG tablet take 1 tablet by mouth once daily 30 tablet 0  . FIBER COMPLETE TABS Take 1 tablet by mouth daily as needed (CONSTIPATION).    . finasteride (PROSCAR) 5 MG tablet take 1 tablet by mouth once daily as directed 30 tablet 1  . Glucosamine-Chondroit-Vit C-Mn (GLUCOSAMINE 1500 COMPLEX PO) Take 2 tablets by mouth daily.    . multivitamin (THERAGRAN) per tablet Take 1 tablet by mouth daily.      . ondansetron (ZOFRAN-ODT) 4 MG disintegrating tablet Take 1 tablet (4 mg  total) by mouth every 6 (six) hours as needed for nausea. 20 tablet 0  . oxyCODONE (OXY IR/ROXICODONE) 5 MG immediate release tablet Take 1-2 tablets (5-10 mg total) by mouth every 4 (four) hours as needed for moderate pain. 30 tablet 0  . pravastatin (PRAVACHOL) 40 MG tablet take 1 tablet by mouth at bedtime 90 tablet 1    Blood pressure 120/71, pulse 96, temperature 98.9 F (37.2 C), temperature source Oral, resp. rate 18, SpO2 97 %. Physical Exam: Physical Exam  Constitutional: He is oriented to person, place, and time. He appears well-developed and well-nourished. He is cooperative.  Non-toxic appearance. No distress.  HENT:  Head:  Normocephalic and atraumatic.  Right Ear: External ear normal.  Left Ear: External ear normal.  Nose: Nose normal.  Mouth/Throat: Oropharynx is clear and moist and mucous membranes are normal.  Eyes: Pupils are equal, round, and reactive to light. Conjunctivae, EOM and lids are normal. No scleral icterus.  Neck: Normal range of motion and phonation normal. Neck supple.  Cardiovascular: Normal rate and regular rhythm.  Pulses:      Radial pulses are 2+ on the right side, and 2+ on the left side.       Dorsalis pedis pulses are 2+ on the right side, and 2+ on the left side.  Pulmonary/Chest: Effort normal and breath sounds normal.  Abdominal: Soft. Bowel sounds are normal. He exhibits no distension. There is no hepatosplenomegaly. There is no tenderness. There is no rigidity, no rebound and no guarding. No hernia.  Musculoskeletal:  ROM grossly intact in bilateral upper and lower extremities. No gross deformities of bilateral upper and lower extremities  Neurological: He is alert and oriented to person, place, and time. He has normal strength. No sensory deficit.  Skin: Skin is warm, dry and intact.  Psychiatric: He has a normal mood and affect. His speech is normal and behavior is normal.    No results found for this or any previous visit (from the past 48 hour(s)). Ct Abdomen Pelvis W Contrast  Result Date: 05/14/2018 CLINICAL DATA:  Patient with right lower quadrant pain status post appendectomy 05/05/2018. EXAM: CT ABDOMEN AND PELVIS WITH CONTRAST TECHNIQUE: Multidetector CT imaging of the abdomen and pelvis was performed using the standard protocol following bolus administration of intravenous contrast. Creatinine was obtained on site at [facility] at [address] Results: Creatinine 1.5 mg/dL. CONTRAST:  92mL ISOVUE-300 IOPAMIDOL (ISOVUE-300) INJECTION 61% COMPARISON:  CT abdomen pelvis 12/16/2016 FINDINGS: Lower chest: Heart is mildly enlarged. Dependent atelectasis within  the bilateral lower lobes. Hepatobiliary: Fatty deposition adjacent to the gallbladder fossa. Subcentimeter too small to characterize low-attenuation lesion left hepatic lobe and right hepatic lobe. No intrahepatic or extrahepatic biliary ductal dilatation. Pancreas: Unremarkable Spleen: Unremarkable Adrenals/Urinary Tract: Normal adrenal glands. There is a 1.8 cm cyst within the superior pole of the right kidney. There is a 1.5 cm cyst superior pole left kidney. No suspicious enhancing renal masses. No hydronephrosis. Urinary bladder is unremarkable. Punctate 2 mm stone superior pole right kidney (image 86; series 3). Stomach/Bowel: Sigmoid colonic diverticulosis. No CT evidence for acute diverticulitis. Postsurgical changes compatible with recent appendectomy. Within the right lower quadrant there is a 7.5 x 4.8 cm fluid collection (image 67; series 2). No evidence for small bowel obstruction. No free intraperitoneal air. Normal morphology of the stomach. Vascular/Lymphatic: Normal caliber abdominal aorta. Peripheral calcified atherosclerotic plaque. No retroperitoneal lymphadenopathy. Mildly enlarged right lower quadrant mesenteric node measuring 1.2 cm (image 54; series  2), likely reactive. Reproductive: Prostate unremarkable. Other: Small fat containing right inguinal hernia. Musculoskeletal: No aggressive or acute appearing osseous lesions. Lower thoracic and lumbar spine degenerative changes. IMPRESSION: 1. There is a 7.5 cm fluid collection within the right lower quadrant at the site of prior appendectomy concerning for abscess formation. 2. No evidence for upstream small bowel obstruction. 3. These results were called by telephone at the time of interpretation on 05/14/2018 at 12:13 pm to Dr. Coralie Keens , who verbally acknowledged these results. Electronically Signed   By: Lovey Newcomer M.D.   On: 05/14/2018 12:13      Assessment/Plan HTN HLD Hx of diverticulosis OSA on CPAP Gout  S/p  laparoscopic appendectomy 05/05/18 Dr. Excell Seltzer RLQ Intra-abdominal abscess - CT 7/12: There is a 7.5 cm fluid collection within the right lower quadrant at the site of prior appendectomy concerning for abscess formation - WBC 14.4, afebrile - IV abx - abdominal exam completely benign - discussed with Dr. Annamaria Boots (IR) - plan for IR drain today  FEN: will start on CLD and ADAT post-drain  VTE: SCDs, lovenox tomorrow ID: [id number] 7/12>>  Brigid Re, [facility] 05/14/2018, 2:09 PM Pager: [phone] Consults: [phone] Mon-Fri 7:00 am-4:30 pm Sat-Sun 7:00 am-11:30 am

## 2018-05-14 NOTE — Consult Note (Signed)
Chief Complaint: Patient was seen in consultation today for  CT guided right abdominal abscess drainage   Referring Physician(s): Blackman,D  Supervising Physician: Daryll Brod  Patient Status: [name]  History of Present Illness: Danny Kennedy is a 72 y.o. male, s/p lap appendectomy 05/05/18 for acute perforated gangrenous appendicitis, who presents now with sl temp elevation, mild RLQ discomfort and CT showing 7.5 cm RLQ fluid collection/abscess.Request received for image guided drainage of this collection.   Past Medical History:  Diagnosis Date  . Alopecia   . Diverticulosis   . Dupuytren contracture    RUE @ thumb  . Gout   . Hyperlipemia    LDL goal = < 100  . Nephrolithiasis [id number]   Dr Serita Butcher, X 2   . OSA on CPAP   . Sleep apnea    CPAP @ 11cm  . Unspecified essential hypertension 05/17/2009    Past Surgical History:  Procedure Laterality Date  . COLONOSCOPY W/ POLYPECTOMY  2005, 2007   x2 negative 10-2007, Dr Sharlett Iles   . LAPAROSCOPIC APPENDECTOMY N/A 05/05/2018   Procedure: APPENDECTOMY LAPAROSCOPIC;  Surgeon: Excell Seltzer, MD;  Location: [facility];  Service: General;  Laterality: N/A;  . renal calculus     retirieved via basket 1984; stone passed spontaneously 2011; Otic tube R TM  . WISDOM TOOTH EXTRACTION      Allergies: Patient has no known allergies.  Medications: Prior to Admission medications   Medication Sig Start Date End Date Taking? Authorizing Provider  acetaminophen (TYLENOL) 500 MG tablet Take 1,000 mg by mouth daily as needed for headache.    [provider]  benazepril (LOTENSIN) 20 MG tablet take 1 tablet by mouth once daily 11/15/12   Hendricks Limes, MD  FIBER COMPLETE TABS Take 1 tablet by mouth daily as needed (CONSTIPATION).    [provider]  finasteride (PROSCAR) 5 MG tablet take 1 tablet by mouth once daily as directed    Hendricks Limes, MD  Glucosamine-Chondroit-Vit C-Mn (GLUCOSAMINE  1500 COMPLEX PO) Take 2 tablets by mouth daily.    [provider]  multivitamin [facility]) per tablet Take 1 tablet by mouth daily.      [provider]  ondansetron (ZOFRAN-ODT) 4 MG disintegrating tablet Take 1 tablet (4 mg total) by mouth every 6 (six) hours as needed for nausea. 05/08/18   Johnathan Hausen, MD  oxyCODONE (OXY IR/ROXICODONE) 5 MG immediate release tablet Take 1-2 tablets (5-10 mg total) by mouth every 4 (four) hours as needed for moderate pain. 05/08/18   Johnathan Hausen, MD  pravastatin (PRAVACHOL) 40 MG tablet take 1 tablet by mouth at bedtime 03/21/14   Hendricks Limes, MD     Family History  Problem Relation Age of Onset  . Cancer Father        LUNG ,THROAT  . Esophageal cancer Father   . Hypertension Maternal Grandmother   . Stroke Maternal Grandmother 72  . Diabetes Maternal Grandmother   . Heart attack Paternal Uncle        X2, neither pre 55  . Heart attack Maternal Uncle        not pre 55  . Lung cancer Maternal Grandfather     Social History   Socioeconomic History  . Marital status: Married    Spouse name: Danny Kennedy  . Number of children: 2  . Years of education: College  . Highest education level: Not on file  Occupational History  . Occupation: executive  Employer: other  Social Needs  . Financial resource strain: Not on file  . Food insecurity:    Worry: Not on file    Inability: Not on file  . Transportation needs:    Medical: Not on file    Non-medical: Not on file  Tobacco Use  . Smoking status: Former Smoker    Last attempt to quit: 11/03/1990    Years since quitting: 27.5  . Smokeless tobacco: Former Systems developer    Types: Chew  . Tobacco comment: Quit 2005  Substance and Sexual Activity  . Alcohol use: Yes    Comment: 2 cans beer a month  . Drug use: No  . Sexual activity: Not on file  Lifestyle  . Physical activity:    Days per week: Not on file    Minutes per session: Not on file  . Stress: Not on file    Relationships  . Social connections:    Talks on phone: Not on file    Gets together: Not on file    Attends religious service: Not on file    Active member of club or organization: Not on file    Attends meetings of clubs or organizations: Not on file    Relationship status: Not on file  Other Topics Concern  . Not on file  Social History Narrative   Patient is married Danny Kennedy) and lives at home with his wife.   Patient owns [employer].   Patient has a college education.   Patient has two sons and four grandchildren.   Patient is right-handed.   Patient drinks coffee, Diet cokes about four + a week.                  Review of Systems  :denies HA,CP,dyspnea, cough, back pain,N/V or bleeding  Vital Signs: BP 120/71 (BP Location: Left Arm)   Pulse 96   Temp 98.9 F (37.2 C) (Oral)   Resp 18   SpO2 97%   Physical Exam awake/alert; chest- CTA with sl dim BS bases; heart- RRR; abd- soft,+BS,miminal RLQ tenderness; no LE edema  Imaging: Ct Abdomen Pelvis W Contrast  Result Date: 05/14/2018 CLINICAL DATA:  Patient with right lower quadrant pain status post appendectomy 05/05/2018. EXAM: CT ABDOMEN AND PELVIS WITH CONTRAST TECHNIQUE: Multidetector CT imaging of the abdomen and pelvis was performed using the standard protocol following bolus administration of intravenous contrast. Creatinine was obtained on site at [place] at [address] Results: Creatinine 1.5 mg/dL. CONTRAST:  24mL ISOVUE-300 IOPAMIDOL (ISOVUE-300) INJECTION 61% COMPARISON:  CT abdomen pelvis 12/16/2016 FINDINGS: Lower chest: Heart is mildly enlarged. Dependent atelectasis within the bilateral lower lobes. Hepatobiliary: Fatty deposition adjacent to the gallbladder fossa. Subcentimeter too small to characterize low-attenuation lesion left hepatic lobe and right hepatic lobe. No intrahepatic or extrahepatic biliary ductal dilatation. Pancreas: Unremarkable Spleen: Unremarkable  Adrenals/Urinary Tract: Normal adrenal glands. There is a 1.8 cm cyst within the superior pole of the right kidney. There is a 1.5 cm cyst superior pole left kidney. No suspicious enhancing renal masses. No hydronephrosis. Urinary bladder is unremarkable. Punctate 2 mm stone superior pole right kidney (image 86; series 3). Stomach/Bowel: Sigmoid colonic diverticulosis. No CT evidence for acute diverticulitis. Postsurgical changes compatible with recent appendectomy. Within the right lower quadrant there is a 7.5 x 4.8 cm fluid collection (image 67; series 2). No evidence for small bowel obstruction. No free intraperitoneal air. Normal morphology of the stomach. Vascular/Lymphatic: Normal caliber abdominal aorta.  Peripheral calcified atherosclerotic plaque. No retroperitoneal lymphadenopathy. Mildly enlarged right lower quadrant mesenteric node measuring 1.2 cm (image 54; series 2), likely reactive. Reproductive: Prostate unremarkable. Other: Small fat containing right inguinal hernia. Musculoskeletal: No aggressive or acute appearing osseous lesions. Lower thoracic and lumbar spine degenerative changes. IMPRESSION: 1. There is a 7.5 cm fluid collection within the right lower quadrant at the site of prior appendectomy concerning for abscess formation. 2. No evidence for upstream small bowel obstruction. 3. These results were called by telephone at the time of interpretation on 05/14/2018 at 12:13 pm to Dr. Coralie Keens , who verbally acknowledged these results. Electronically Signed   By: Lovey Newcomer M.D.   On: 05/14/2018 12:13    Labs:  CBC: Recent Labs    05/05/18 1758 05/06/18 0558 05/07/18 0552 05/08/18 0520  WBC 14.2* 14.8* 11.7* 7.4  HGB 15.1 13.9 11.8* 11.4*  HCT 45.7 43.3 36.9* 36.1*  PLT 232 217 195 221    COAGS: No results for input(s): INR, APTT in the last 8760 hours.  BMP: Recent Labs    05/05/18 1758 05/06/18 0558 05/07/18 0552 05/08/18 0520  NA 137 138 139 140  K 4.9 5.0  4.9 4.9  CL 98 105 107 109  CO2 27 23 25 25   GLUCOSE 140* 167* 104* 94  BUN 18 24* 40* 36*  CALCIUM 9.5 8.2* 8.0* 8.1*  CREATININE 1.37* 1.70* 1.76* 1.59*  GFRNONAA 50* 39* 37* 42*  GFRAA 58* 45* 43* 49*    LIVER FUNCTION TESTS: Recent Labs    05/05/18 1758  BILITOT 1.1  AST 21  ALT 26  ALKPHOS 50  PROT 7.9  ALBUMIN 4.2    TUMOR MARKERS: No results for input(s): AFPTM, CEA, CA199, CHROMGRNA in the last 8760 hours.  Assessment and Plan: 72 y.o. male, s/p lap appendectomy 05/05/18 for acute perforated gangrenous appendicitis, who presents now with sl temp elevation, mild RLQ discomfort and CT showing 7.5 cm RLQ fluid collection/abscess.Request received for image guided drainage of this collection.Imaging studies were reviewed by Dr. Annamaria Boots. Risks and benefits discussed with the patient including bleeding, infection, damage to adjacent structures, bowel perforation/fistula connection, and sepsis.  All of the patient's questions were answered, patient is agreeable to proceed. Consent signed and in chart.  Procedure scheduled for today   Thank you for this interesting consult.  I greatly enjoyed meeting Michaelangelo Mittelman and look forward to participating in their care.  A copy of this report was sent to the requesting provider on this date.  Electronically Signed: D. Rowe Robert, PA-C 05/14/2018, 3:07 PM   I spent a total of  25 minutes   in face to face in clinical consultation, greater than 50% of which was counseling/coordinating care for CT guided right abdominal abscess drain

## 2018-05-14 NOTE — Progress Notes (Signed)
IV team paged for PIV prior to CT guided PERC tube. Donne Hazel, RN

## 2018-05-14 NOTE — Progress Notes (Signed)
Dr Donne Hazel paged to request Colchicine 0.6mg  PO TID PRN. Donne Hazel, RN

## 2018-05-14 NOTE — Progress Notes (Signed)
Pt. Has home CPAP and equipment.  Does not need assistance with device.  No frayed wires on machine noted.  Will call if he needs assistance during night.

## 2018-05-14 NOTE — Procedures (Signed)
Post op appy abscess  S/p CT RLQ abscess drain  No comp Stable EBL min Full report in pacs cx sent

## 2018-05-15 DIAGNOSIS — Z87891 Personal history of nicotine dependence: Secondary | ICD-10-CM | POA: Diagnosis not present

## 2018-05-15 DIAGNOSIS — T8143XA Infection following a procedure, organ and space surgical site, initial encounter: Secondary | ICD-10-CM | POA: Diagnosis not present

## 2018-05-15 DIAGNOSIS — R509 Fever, unspecified: Secondary | ICD-10-CM | POA: Diagnosis not present

## 2018-05-15 DIAGNOSIS — G4733 Obstructive sleep apnea (adult) (pediatric): Secondary | ICD-10-CM | POA: Diagnosis not present

## 2018-05-15 DIAGNOSIS — M109 Gout, unspecified: Secondary | ICD-10-CM | POA: Diagnosis not present

## 2018-05-15 DIAGNOSIS — I10 Essential (primary) hypertension: Secondary | ICD-10-CM | POA: Diagnosis not present

## 2018-05-15 DIAGNOSIS — E785 Hyperlipidemia, unspecified: Secondary | ICD-10-CM | POA: Diagnosis not present

## 2018-05-15 LAB — CBC
HEMATOCRIT: 38.8 % — AB (ref 39.0–52.0)
HEMOGLOBIN: 12.3 g/dL — AB (ref 13.0–17.0)
MCH: 29.9 pg (ref 26.0–34.0)
MCHC: 31.7 g/dL (ref 30.0–36.0)
MCV: 94.4 fL (ref 78.0–100.0)
Platelets: 416 10*3/uL — ABNORMAL HIGH (ref 150–400)
RBC: 4.11 MIL/uL — ABNORMAL LOW (ref 4.22–5.81)
RDW: 14.6 % (ref 11.5–15.5)
WBC: 13.4 10*3/uL — ABNORMAL HIGH (ref 4.0–10.5)

## 2018-05-15 LAB — BASIC METABOLIC PANEL
ANION GAP: 9 (ref 5–15)
BUN: 34 mg/dL — AB (ref 8–23)
CO2: 26 mmol/L (ref 22–32)
Calcium: 8.8 mg/dL — ABNORMAL LOW (ref 8.9–10.3)
Chloride: 103 mmol/L (ref 98–111)
Creatinine, Ser: 1.47 mg/dL — ABNORMAL HIGH (ref 0.61–1.24)
GFR calc Af Amer: 54 mL/min — ABNORMAL LOW (ref >60)
GFR calc non Af Amer: 46 mL/min — ABNORMAL LOW (ref >60)
GLUCOSE: 116 mg/dL — AB (ref 70–99)
POTASSIUM: 4.3 mmol/L (ref 3.5–5.1)
Sodium: 138 mmol/L (ref 135–145)

## 2018-05-15 MED ORDER — SODIUM CHLORIDE 0.9 % IV SOLN: 250.0000 mL | INTRAVENOUS | Status: DC | PRN

## 2018-05-15 MED ORDER — LACTATED RINGERS IV BOLUS: 1000.0000 mL | Freq: Three times a day (TID) | INTRAVENOUS | Status: DC | PRN

## 2018-05-15 MED ORDER — ALUM & MAG HYDROXIDE-SIMETH 200-200-20 MG/5ML PO SUSP: 30.0000 mL | Freq: Four times a day (QID) | ORAL | Status: DC | PRN

## 2018-05-15 MED ORDER — ADULT MULTIVITAMIN W/MINERALS CH
1.0000 | ORAL_TABLET | Freq: Every day | ORAL | Status: DC
  Administered 2018-05-15: 1 via ORAL
  Filled 2018-05-15: qty 1

## 2018-05-15 MED ORDER — METRONIDAZOLE 500 MG PO TABS: 500.0000 mg | ORAL_TABLET | Freq: Two times a day (BID) | ORAL | 1 refills | Status: DC

## 2018-05-15 MED ORDER — POLYETHYLENE GLYCOL 3350 17 G PO PACK: 17.0000 g | PACK | Freq: Two times a day (BID) | ORAL | Status: DC | PRN

## 2018-05-15 MED ORDER — COLCHICINE 0.6 MG PO TABS: 0.6000 mg | ORAL_TABLET | Freq: Three times a day (TID) | ORAL | Status: DC | PRN

## 2018-05-15 MED ORDER — SODIUM CHLORIDE 0.9% FLUSH: 3.0000 mL | INTRAVENOUS | Status: DC | PRN

## 2018-05-15 MED ORDER — BISACODYL 10 MG RE SUPP: 10.0000 mg | Freq: Two times a day (BID) | RECTAL | Status: DC | PRN

## 2018-05-15 MED ORDER — CALCIUM POLYCARBOPHIL 625 MG PO TABS
625.0000 mg | ORAL_TABLET | Freq: Every day | ORAL | Status: DC
  Administered 2018-05-15: 625 mg via ORAL
  Filled 2018-05-15: qty 1

## 2018-05-15 MED ORDER — SODIUM CHLORIDE 0.9% FLUSH
3.0000 mL | Freq: Two times a day (BID) | INTRAVENOUS | Status: DC
  Administered 2018-05-15: 3 mL via INTRAVENOUS

## 2018-05-15 MED ORDER — HYDROCORTISONE 1 % EX CREA
1.0000 "application " | TOPICAL_CREAM | Freq: Three times a day (TID) | CUTANEOUS | Status: DC | PRN
  Filled 2018-05-15: qty 28

## 2018-05-15 MED ORDER — PHENOL 1.4 % MT LIQD
1.0000 | OROMUCOSAL | Status: DC | PRN
  Filled 2018-05-15: qty 177

## 2018-05-15 MED ORDER — MENTHOL 3 MG MT LOZG: 1.0000 | LOZENGE | OROMUCOSAL | Status: DC | PRN

## 2018-05-15 MED ORDER — HYDROCORTISONE 2.5 % RE CREA
1.0000 "application " | TOPICAL_CREAM | Freq: Four times a day (QID) | RECTAL | Status: DC | PRN
  Filled 2018-05-15: qty 28.35

## 2018-05-15 MED ORDER — MAGIC MOUTHWASH
15.0000 mL | Freq: Four times a day (QID) | ORAL | Status: DC | PRN
  Filled 2018-05-15: qty 15

## 2018-05-15 MED ORDER — SACCHAROMYCES BOULARDII 250 MG PO CAPS
250.0000 mg | ORAL_CAPSULE | Freq: Two times a day (BID) | ORAL | Status: DC
  Administered 2018-05-15: 250 mg via ORAL
  Filled 2018-05-15: qty 1

## 2018-05-15 MED ORDER — METHOCARBAMOL 1000 MG/10ML IJ SOLN
1000.0000 mg | Freq: Four times a day (QID) | INTRAVENOUS | Status: DC | PRN
  Filled 2018-05-15: qty 10

## 2018-05-15 MED ORDER — GUAIFENESIN 100 MG/5ML PO SOLN: 400.0000 mg | ORAL | Status: DC | PRN

## 2018-05-15 MED ORDER — LIP MEDEX EX OINT
1.0000 "application " | TOPICAL_OINTMENT | Freq: Two times a day (BID) | CUTANEOUS | Status: DC
  Administered 2018-05-15: 1 via TOPICAL
  Filled 2018-05-15: qty 7

## 2018-05-15 MED ORDER — CIPROFLOXACIN HCL 500 MG PO TABS: 500.0000 mg | ORAL_TABLET | Freq: Two times a day (BID) | ORAL | 1 refills | Status: DC

## 2018-05-15 NOTE — Discharge Instructions (Signed)
Take the new antibiotic regimen to help with this abscess heal up.  Keep appointment at our office this coming Tuesday to make sure you are getting better.    DRAIN CARE:   You have a closed bulb drain to help you heal.    A bulb drain is a small, plastic reservoir which creates a gentle suction. It is used to remove excess fluid from a surgical wound. The color and amount of fluid will vary. Immediately after surgery, the fluid is bright red. It may gradually change to a yellow color. When the amount decreases to about 1 or 2 tablespoons (15 to 30 cc) per 24 hours, your caregiver will usually remove it.  JP Care  The Jackson-Pratt drainage system has flexible tubing attached to a soft, plastic bulb with a stopper. The drainage end of the tubing, which is flat and white, goes into your body through a small opening near your incision (surgical cut). A stitch holds the drainage end in place. The rest of the tube is outside your body, attached to the bulb. When the bulb is compressed with the stopper in place, it creates a vacuum. This causes a constant gentle suction, which helps draw out fluid that collects under your incision. The bulb should be compressed at all times, except when you are emptying the drainage.  How long you will have your Jackson-Pratt depends on your surgery and the amount of fluid is draining. This is different for everyone. The Jackson-Pratt is usually removed when the drainage is 30 mL or less over 24 hours. To keep track of how much drainage youre having, you will record the amount in a drainage log. Its important to bring the log with you to your follow-up appointments.  Caring for Your Jackson-Pratt at Home In order to care for your Jackson-Pratt at home, you or your caregiver will do the following:  Empty the drain once a day and record the color and amount of drainage  Care for the area where the tubing enters your skin by washing with soap and water.  Milk the  tubing to help move clots into the bulb.  Do this before you empty and measure your drainage. Look in the mirror at the tubing. This will help you see where your hands need to be. Pinch the tubing close to where it goes into your skin between your thumb and forefinger. With the thumb and forefinger of your other hand, pinch the tubing right below your other fingers. Keep your fingers pinched and slide them down the tubing, pushing any clots down toward the bulb. You may want to use alcohol swabs to help you slide your fingers down the tubing. Repeat steps 3 and 4 as necessary to push clots from the tubing into the bulb. If you are not able to move a clot into the bulb, call your doctors office. The fluid may leak around the insertion site if a clot is blocking the drainage flow. If there is fluid in the bulb and no leakage at the insertion site, the drain is working.  How to Empty Your Jackson-Pratt and Record the Drainage You will need to empty your Jackson-Pratt every day  Gather the following supplies:  Measuring container your nurse gave you Jackson-Pratt Drainage Record  Pen or pencil  Instructions Clean an area to work on. Clean your hands thoroughly. Unplug the stopper on top of your Jackson-Pratt. This will cause the bulb to expand. Do not touch the inside of the stopper  or the inner area of the opening on the bulb. Turn your Jackson-Pratt upside down, gently squeeze the bulb, and pour the drainage into the measuring container. Turn your Jackson-Pratt right side up. Squeeze the bulb until your fingers feel the palm of your hand. Keep squeezing the bulb while you replug the stopper. Make sure the bulb stays fully compressed to ensure constant, gentle suction.    Check the amount and color of drainage in the measuring container. The first couple days after surgery the fluid may be dark red. This is normal. As you heal the fluid may look pink or pale yellow. Record this amount and  the color of drainage on your Jackson-Pratt Drainage Record. Flush the drainage down the toilet and rinse the measuring container with water.  Caring for the Insertion Site  Once you have emptied the drainage, clean your hands again. Check the area around the insertion site. Look for tenderness, swelling, or pus. If you have any of these, or if you have a temperature of 101 F (38.3 C) or higher, you may have an infection. Call your doctors office.  Sometimes, the drain causes redness the size of a dime at your insertion site. This is normal. Your healthcare provider will tell you if you should place a bandage over the insertion site.  Wash drain site with soap & water (dilute hydrogen peroxide PRN) daily & replace clean dressing / tape    DAILY CARE  Keep the bulb compressed at all times, except while emptying it. The compression creates suction.   Keep sites where the tubes enter the skin dry and covered with a light bandage (dressing).   Tape the tubes to your skin, 1 to 2 inches below the insertion sites, to keep from pulling on your stitches. Tubes are stitched in place and will not slip out.   Pin the bulb to your shirt (not to your pants) with a safety pin.   For the first few days after surgery, there usually is more fluid in the bulb. Empty the bulb whenever it becomes half full because the bulb does not create enough suction if it is too full. Include this amount in your 24 hour totals.   When the amount of drainage decreases, empty the bulb at the same time every day. Write down the amounts and the 24 hour totals. Your caregiver will want to know them. This helps your caregiver know when the tubes can be removed.   (We anticipate removing the drain in 1-3 weeks, depending on when the output is <86mL a day for 2+ days)  If there is drainage around the tube sites, change dressings and keep the area dry. If you see a clot in the tube, leave it alone. However, if the tube does  not appear to be draining, let your caregiver know.  TO EMPTY THE BULB  Open the stopper to release suction.   Holding the stopper out of the way, pour drainage into the measuring cup that was sent home with you.   Measure and write down the amount. If there are 2 bulbs, note the amount of drainage from bulb 1 or bulb 2 and keep the totals separate. Your caregiver will want to know which tube is draining more.   Compress the bulb by folding it in half.   Replace the stopper.   Check the tape that holds the tube to your skin, and pin the bulb to your shirt.  SEEK MEDICAL CARE IF:  The  drainage develops a bad odor.   You have an oral temperature above 102 F (38.9 C).   The amount of drainage from your wound suddenly increases or decreases.   You accidentally pull out your drain.   You have any other questions or concerns.  MAKE SURE YOU:   Understand these instructions.   Will watch your condition.   Will get help right away if you are not doing well or get worse.     Call our office if you have any questions about your drain. [phone]    Percutaneous Abscess Drain Percutaneous abscess drain is removal of a collection of infected fluid inside the body (abscess). This is done by placing a thin needle under the skin and moving it into the abscess. A small tube (catheter) is inserted during the procedure and left in place for a few days to continue to drain the abscess. Tell a health care provider about:  Any allergies you have.  All medicines you are taking, including vitamins, herbs, eye drops, creams, and over-the-counter medicines.  Any problems you or family members have had with anesthetic medicines.  Any blood disorders you have.  Any surgeries you have had.  Any medical conditions you have.  Whether you are pregnant or may be pregnant.  Any history of tobacco use or smoking. What are the risks? Generally, this is a safe procedure. However, problems  may occur, including:  Infection.  Bleeding.  Allergic reaction to medicines or materials used.  Damage to other structures or organs.  Blockage of the catheter, requiring placement of a new catheter.  A need to repeat the procedure.  Failure of the procedure to drain the abscess completely, requiring an open surgical procedure to drain the abscess. An open procedure is done through a larger incision.  What happens before the procedure? Medicines  Ask your health care provider about: ? Changing or stopping your regular medicines. This is especially important if you are taking diabetes medicines or blood thinners. ? Taking medicines such as aspirin and ibuprofen. These medicines can thin your blood. Do not take these medicines before your procedure if your health care provider instructs you not to. Staying hydrated Follow instructions from your health care provider about hydration, which may include:  Up to 2 hours before the procedure - you may continue to drink clear liquids, such as water, clear fruit juice, black coffee, and plain tea.  Eating and drinking restrictions Follow instructions from your health care provider about eating and drinking, which may include:  8 hours before the procedure - stop eating heavy meals or foods such as meat, fried foods, or fatty foods.  6 hours before the procedure - stop eating light meals or foods, such as toast or cereal.  6 hours before the procedure - stop drinking milk or drinks that contain milk.  2 hours before the procedure - stop drinking clear liquids.  General instructions   Plan to have someone take you home from the hospital or clinic.  If you will be going home right after the procedure, plan to have someone with you for 24 hours.  You may have blood tests or urine tests.  You may get a tetanus shot.  You may have imaging tests, such as an ultrasound, to check how large or deep your abscess is. What happens during  the procedure?  To lower your risk of infection: ? Your health care team will wash or sanitize their hands. ? The skin around the  abscess will be washed with soap. ? Hair may be removed from the surgical site.  An IV tube will be inserted into one of your veins.  You will be given medicine to numb the area (local anesthetic) where the catheter will be placed. Placement of the catheter varies depending on where your abscess is located.  You may be given medicine to help you relax (sedative) or medicine to make you fall asleep (general anesthetic).  A small incision will be made in your skin.  A needle will be inserted under your skin and moved into the abscess. Images from ultrasound, X-ray, or a CT scan will be used to help guide the needle to the abscess.  A catheter will be inserted into your incision and moved underneath your skin until it reaches the abscess. Images will be used to help guide the catheter to the abscess.  After the catheter is in place, the needle will be removed. The catheter will be connected to a bag outside of your body. The catheter will stay in place until the fluid has stopped draining and the infection is gone. The procedure may vary among health care providers and hospitals. What happens after the procedure?  Your blood pressure, heart rate, breathing rate, and blood oxygen level will be monitored until the medicines you were given have worn off.  You may have some pain or nausea. Medicines will be available to help you. Summary  An abscess is a collection of infected fluid inside the body.  During this procedure, images from ultrasound, X-rays, or a CT scan are used to help guide the needle and catheter to the abscess.  A catheter will be left in place to continue to drain the abscess after the procedure. This information is not intended to replace advice given to you by your health care provider. Make sure you discuss any questions you have with your  health care provider. Document Released: 03/06/2014 Document Revised: 09/11/2016 Document Reviewed: 09/11/2016 Elsevier Interactive Patient Education  2017 [place]: POST OP INSTRUCTIONS (Surgery for small bowel obstruction, colon resection, etc)   ######################################################################  EAT Gradually transition to a high fiber diet with a fiber supplement over the next few days after discharge  WALK Walk an hour a day.  Control your pain to do that.    CONTROL PAIN Control pain so that you can walk, sleep, tolerate sneezing/coughing, go up/down stairs.  HAVE A BOWEL MOVEMENT DAILY Keep your bowels regular to avoid problems.  OK to try a laxative to override constipation.  OK to use an antidairrheal to slow down diarrhea.  Call if not better after 2 tries  CALL IF YOU HAVE PROBLEMS/CONCERNS Call if you are still struggling despite following these instructions. Call if you have concerns not answered by these instructions  ######################################################################   DIET Follow a light diet the first few days at home.  Start with a bland diet such as soups, liquids, starchy foods, low fat foods, etc.  If you feel full, bloated, or constipated, stay on a ful liquid or pureed/blenderized diet for a few days until you feel better and no longer constipated. Be sure to drink plenty of fluids every day to avoid getting dehydrated (feeling dizzy, not urinating, etc.). Gradually add a fiber supplement to your diet over the next week.  Gradually get back to a regular solid diet.  Avoid fast food or heavy meals the first week as you are more likely to get nauseated. It is expected  for your digestive tract to need a few months to get back to normal.  It is common for your bowel movements and stools to be irregular.  You will have occasional bloating and cramping that should eventually fade away.  Until you are eating  solid food normally, off all pain medications, and back to regular activities; your bowels will not be normal. Focus on eating a low-fat, high fiber diet the rest of your life (See Getting to [place], below).  CARE of your INCISION or WOUND It is good for closed incision and even open wounds to be washed every day.  Shower every day.  Short baths are fine.  Wash the incisions and wounds clean with soap & water.    If you have a closed incision(s), wash the incision with soap & water every day.  You may leave closed incisions open to air if it is dry.   You may cover the incision with clean gauze & replace it after your daily shower for comfort. If you have skin tapes (Steristrips) or skin glue (Dermabond) on your incision, leave them in place.  They will fall off on their own like a scab.  You may trim any edges that curl up with clean scissors.  If you have staples, set up an appointment for them to be removed in the office in 10 days after surgery.  If you have a drain, wash around the skin exit site with soap & water and place a new dressing of gauze or band aid around the skin every day.  Keep the drain site clean & dry.    If you have an open wound with packing, see wound care instructions.  In general, it is encouraged that you remove your dressing and packing, shower with soap & water, and replace your dressing once a day.  Pack the wound with clean gauze moistened with normal (0.9%) saline to keep the wound moist & uninfected.  Pressure on the dressing for 30 minutes will stop most wound bleeding.  Eventually your body will heal & pull the open wound closed over the next few months.  Raw open wounds will occasionally bleed or secrete yellow drainage until it heals closed.  Drain sites will drain a little until the drain is removed.  Even closed incisions can have mild bleeding or drainage the first few days until the skin edges scab over & seal.   If you have an open wound with a wound  vac, see wound vac care instructions.     ACTIVITIES as tolerated Start light daily activities --- self-care, walking, climbing stairs-- beginning the day after surgery.  Gradually increase activities as tolerated.  Control your pain to be active.  Stop when you are tired.  Ideally, walk several times a day, eventually an hour a day.   Most people are back to most day-to-day activities in a few weeks.  It takes 4-8 weeks to get back to unrestricted, intense activity. If you can walk 30 minutes without difficulty, it is safe to try more intense activity such as jogging, treadmill, bicycling, low-impact aerobics, swimming, etc. Save the most intensive and strenuous activity for last (Usually 4-8 weeks after surgery) such as sit-ups, heavy lifting, contact sports, etc.  Refrain from any intense heavy lifting or straining until you are off narcotics for pain control.  You will have off days, but things should improve week-by-week. DO NOT PUSH THROUGH PAIN.  Let pain be your guide: If it hurts  to do something, don't do it.  Pain is your body warning you to avoid that activity for another week until the pain goes down. You may drive when you are no longer taking narcotic prescription pain medication, you can comfortably wear a seatbelt, and you can safely make sudden turns/stops to protect yourself without hesitating due to pain. You may have sexual intercourse when it is comfortable. If it hurts to do something, stop.  MEDICATIONS Take your usually prescribed home medications unless otherwise directed.   Blood thinners:  Usually you can restart any strong blood thinners after the second postoperative day.  It is OK to take aspirin right away.     If you are on strong blood thinners (warfarin/Coumadin, Plavix, Xerelto, Eliquis, Pradaxa, etc), discuss with your surgeon, medicine PCP, and/or cardiologist for instructions on when to restart the blood thinner & if blood monitoring is needed (PT/INR blood  check, etc).     PAIN CONTROL Pain after surgery or related to activity is often due to strain/injury to muscle, tendon, nerves and/or incisions.  This pain is usually short-term and will improve in a few months.  To help speed the process of healing and to get back to regular activity more quickly, DO THE FOLLOWING THINGS TOGETHER: 1. Increase activity gradually.  DO NOT PUSH THROUGH PAIN 2. Use Ice and/or Heat 3. Try Gentle Massage and/or Stretching 4. Take over the counter pain medication 5. Take Narcotic prescription pain medication for more severe pain  Good pain control = faster recovery.  It is better to take more medicine to be more active than to stay in bed all day to avoid medications. 1.  Increase activity gradually Avoid heavy lifting at first, then increase to lifting as tolerated over the next 6 weeks. Do not push through the pain.  Listen to your body and avoid positions and maneuvers than reproduce the pain.  Wait a few days before trying something more intense Walking an hour a day is encouraged to help your body recover faster and more safely.  Start slowly and stop when getting sore.  If you can walk 30 minutes without stopping or pain, you can try more intense activity (running, jogging, aerobics, cycling, swimming, treadmill, sex, sports, weightlifting, etc.) Remember: If it hurts to do it, then dont do it! 2. Use Ice and/or Heat You will have swelling and bruising around the incisions.  This will take several weeks to resolve. Ice packs or heating pads (6-8 times a day, 30-60 minutes at a time) will help sooth soreness & bruising. Some people prefer to use ice alone, heat alone, or alternate between ice & heat.  Experiment and see what works best for you.  Consider trying ice for the first few days to help decrease swelling and bruising; then, switch to heat to help relax sore spots and speed recovery. Shower every day.  Short baths are fine.  It feels good!  Keep the  incisions and wounds clean with soap & water.   3. Try Gentle Massage and/or Stretching Massage at the area of pain many times a day Stop if you feel pain - do not overdo it 4. Take over the counter pain medication This helps the muscle and nerve tissues become less irritable and calm down faster Choose ONE of the following over-the-counter anti-inflammatory medications: Acetaminophen 500mg  tabs (Tylenol) 1-2 pills with every meal and just before bedtime (avoid if you have liver problems or if you have acetaminophen in you narcotic prescription) Naproxen  220mg  tabs (ex. Aleve, Naprosyn) 1-2 pills twice a day (avoid if you have kidney, stomach, IBD, or bleeding problems) Ibuprofen 200mg  tabs (ex. Advil, Motrin) 3-4 pills with every meal and just before bedtime (avoid if you have kidney, stomach, IBD, or bleeding problems) Take with food/snack several times a day as directed for at least 2 weeks to help keep pain / soreness down & more manageable. 5. Take Narcotic prescription pain medication for more severe pain A prescription for strong pain control is often given to you upon discharge (for example: oxycodone/Percocet, hydrocodone/Norco/Vicodin, or tramadol/Ultram) Take your pain medication as prescribed. Be mindful that most narcotic prescriptions contain Tylenol (acetaminophen) as well - avoid taking too much Tylenol. If you are having problems/concerns with the prescription medicine (does not control pain, nausea, vomiting, rash, itching, etc.), please call us [phone] to see if we need to switch you to a different pain medicine that will work better for you and/or control your side effects better. If you need a refill on your pain medication, you must call the office before 4 pm and on weekdays only.  By federal law, prescriptions for narcotics cannot be called into a pharmacy.  They must be filled out on paper & picked up from our office by the patient or authorized caretaker.   Prescriptions cannot be filled after 4 pm nor on weekends.    WHEN TO CALL us [phone] Severe uncontrolled or worsening pain  Fever over 101 F (38.5 C) Concerns with the incision: Worsening pain, redness, rash/hives, swelling, bleeding, or drainage Reactions / problems with new medications (itching, rash, hives, nausea, etc.) Nausea and/or vomiting Difficulty urinating Difficulty breathing Worsening fatigue, dizziness, lightheadedness, blurred vision Other concerns If you are not getting better after two weeks or are noticing you are getting worse, contact our office [phone] for further advice.  We may need to adjust your medications, re-evaluate you in the office, send you to the emergency room, or see what other things we can do to help. The clinic staff is available to answer your questions during regular business hours (8:30am-5pm).  Please dont hesitate to call and ask to speak to one of our nurses for clinical concerns.    A surgeon from [facility] is always on call at the hospitals 24 hours/day If you have a medical emergency, go to the nearest emergency room or call 911.  FOLLOW UP in our office One the day of your discharge from the hospital (or the next business weekday), please call [facility] to set up or confirm an appointment to see your surgeon in the office for a follow-up appointment.  Usually it is 2-3 weeks after your surgery.   If you have skin staples at your incision(s), let the office know so we can set up a time in the office for the nurse to remove them (usually around 10 days after surgery). Make sure that you call for appointments the day of discharge (or the next business weekday) from the hospital to ensure a convenient appointment time. IF YOU HAVE DISABILITY OR FAMILY LEAVE FORMS, BRING THEM TO THE OFFICE FOR PROCESSING.  DO NOT GIVE THEM TO YOUR DOCTOR.  [facility] [address] [place],  [place], [place]  [phone] ? [phone] - Main [phone] - [place],  [phone] - Fax [email or web address]  GETTING TO GOOD BOWEL HEALTH. It is expected for your digestive tract to need a few months to get back  to normal.  It is common for your bowel movements and stools to be irregular.  You will have occasional bloating and cramping that should eventually fade away.  Until you are eating solid food normally, off all pain medications, and back to regular activities; your bowels will not be normal.   Avoiding constipation The goal: ONE SOFT BOWEL MOVEMENT A DAY!    Drink plenty of fluids.  Choose water first. TAKE A FIBER SUPPLEMENT EVERY DAY THE REST OF YOUR LIFE During your first week back home, gradually add back a fiber supplement every day Experiment which form you can tolerate.   There are many forms such as powders, tablets, wafers, gummies, etc Psyllium bran (Metamucil), methylcellulose (Citrucel), Miralax or Glycolax, Benefiber, Flax Seed.  Adjust the dose week-by-week (1/2 dose/day to 6 doses a day) until you are moving your bowels 1-2 times a day.  Cut back the dose or try a different fiber product if it is giving you problems such as diarrhea or bloating. Sometimes a laxative is needed to help jump-start bowels if constipated until the fiber supplement can help regulate your bowels.  If you are tolerating eating & you are farting, it is okay to try a gentle laxative such as double dose MiraLax, prune juice, or Milk of Magnesia.  Avoid using laxatives too often. Stool softeners can sometimes help counteract the constipating effects of narcotic pain medicines.  It can also cause diarrhea, so avoid using for too long. If you are still constipated despite taking fiber daily, eating solids, and a few doses of laxatives, call our office. Controlling diarrhea Try drinking liquids and eating bland foods for a few days to avoid stressing your intestines further. Avoid dairy  products (especially milk & ice cream) for a short time.  The intestines often can lose the ability to digest lactose when stressed. Avoid foods that cause gassiness or bloating.  Typical foods include beans and other legumes, cabbage, broccoli, and dairy foods.  Avoid greasy, spicy, fast foods.  Every person has some sensitivity to other foods, so listen to your body and avoid those foods that trigger problems for you. Probiotics (such as active yogurt, Align, etc) may help repopulate the intestines and colon with normal bacteria and calm down a sensitive digestive tract Adding a fiber supplement gradually can help thicken stools by absorbing excess fluid and retrain the intestines to act more normally.  Slowly increase the dose over a few weeks.  Too much fiber too soon can backfire and cause cramping & bloating. It is okay to try and slow down diarrhea with a few doses of antidiarrheal medicines.   Bismuth subsalicylate (ex. Kayopectate, Pepto Bismol) for a few doses can help control diarrhea.  Avoid if pregnant.   Loperamide (Imodium) can slow down diarrhea.  Start with one tablet (2mg ) first.  Avoid if you are having fevers or severe pain.  ILEOSTOMY PATIENTS WILL HAVE CHRONIC DIARRHEA since their colon is not in use.    Drink plenty of liquids.  You will need to drink even more glasses of water/liquid a day to avoid getting dehydrated. Record output from your ileostomy.  Expect to empty the bag every 3-4 hours at first.  Most people with a permanent ileostomy empty their bag 4-6 times at the least.   Use antidiarrheal medicine (especially Imodium) several times a day to avoid getting dehydrated.  Start with a dose at bedtime & breakfast.  Adjust up or down as needed.  Increase antidiarrheal medications as  directed to avoid emptying the bag more than 8 times a day (every 3 hours). Work with your wound ostomy nurse to learn care for your ostomy.  See ostomy care instructions. TROUBLESHOOTING  IRREGULAR BOWELS 1) Start with a soft & bland diet. No spicy, greasy, or fried foods.  2) Avoid gluten/wheat or dairy products from diet to see if symptoms improve. 3) Miralax 17gm or flax seed mixed in [place]. water or juice-daily. May use 2-4 times a day as needed. 4) Gas-X, Phazyme, etc. as needed for gas & bloating.  5) Prilosec (omeprazole) over-the-counter as needed 6)  Consider probiotics (Align, Activa, etc) to help calm the bowels down  Call your doctor if you are getting worse or not getting better.  Sometimes further testing (cultures, endoscopy, X-ray studies, CT scans, bloodwork, etc.) may be needed to help diagnose and treat the cause of the diarrhea. [facility], [place], [place], [place],   [address] [phone] - Main.    [phone]  - Toll Free.   [email or web address] - Fax [email or web address]

## 2018-05-15 NOTE — Discharge Summary (Signed)
Physician Discharge Summary    Patient ID: Danny Kennedy MRN: [id number] DOB/AGE: Aug 30, 1946  72 y.o.  Admit date: 05/14/2018 Discharge date: 05/15/2018   Hospital Stay = 1 days  Patient Care Team: Marton Redwood, MD as PCP - General (Internal Medicine) Excell Seltzer, MD as Consulting Physician (General Surgery) Greggory Keen, MD as Consulting Physician (Interventional Radiology)  Discharge Diagnoses:  Principal Problem:   Intraabdominal abscess w h/o perforated gangrenous appendicitis Active Problems:   Acute gangrenous appendicitis s/p lap appendectomy 05/05/2018    Consults: Interventional radiology for percutaneous drain placement of intra-abdominal abscess  Hospital Course:   Patient required urgent appendectomy for gangrenous appendicitis 10 days ago.  Recovered relatively well but had recurrent fevers.  CT scan showing postoperative abscess.  He was admitted.  Placed on intravenous antibiotics.  Interventional radiology percutaneously drained an abscess.  The patient quickly mobilized and advanced to a solid diet.  Pain and other symptoms were treated aggressively.    By the time of discharge, the patient was walking well the hallways, eating food, having flatus.  Patient denied any pain.  While the drainage output was starting to look feculent he otherwise felt great and wished to go home.   Based on meeting discharge criteria and continuing to recover, I felt it was safe for the patient to be discharged from the hospital to further recover with close followup.  Nursing agreed.  I did caution him that he will need the drain for several weeks and need outpatient follow-up most likely for drain study to make sure he is not developing a fistula from breakdown at the staple line given the gangrenous infected state of the appendicitis.  He is certainly not toxic though.  He has appointment to see [place] in 3 days in our office already.  Postoperative recommendations were discussed  in detail.  They are written as well.  Discharged Condition: good  Disposition:  Follow-up Information    Greggory Keen, MD. Schedule an appointment as soon as possible for a visit in 2 week(s).   Specialties:  Interventional Radiology, Radiology Why:  Call to make appointment with the interventional radiology drain clinic for outpatient drain study.  Need to rule out fistula or persistent abscess before removing the drain with an outpatient x-ray study Contact information: [place] [address] [phone]        [facility], [place] Follow up on 05/18/2018.   Specialty:  General Surgery Why:  Keep appointment to follow-up after your emergency surgery for your perforated gangrenous appendix and abscess that now has a drain Contact information: [address] [place] [phone]          Discharge disposition: 01-Home or Self Care       Discharge Instructions    Call MD for:   Complete by:  As directed    FEVER > 101.5 F  (temperatures < 101.5 F are not significant)   Call MD for:  extreme fatigue   Complete by:  As directed    Call MD for:  persistant dizziness or light-headedness   Complete by:  As directed    Call MD for:  persistant nausea and vomiting   Complete by:  As directed    Call MD for:  redness, tenderness, or signs of infection (pain, swelling, redness, odor or green/yellow discharge around incision site)   Complete by:  As directed    Call MD for:  severe uncontrolled  pain   Complete by:  As directed    Diet - low sodium heart healthy   Complete by:  As directed    Start with a bland diet such as soups, liquids, starchy foods, low fat foods, etc. the first few days at home. Gradually advance to a solid, low-fat, high fiber diet by the end of the first week at home.   Add a fiber supplement to your diet (Metamucil, etc) If you feel full, bloated, or constipated, stay  on a full liquid or pureed/blenderized diet for a few days until you feel better and are no longer constipated.   Discharge instructions   Complete by:  As directed    See Discharge Instructions If you are not getting better after two weeks or are noticing you are getting worse, contact our office [phone] for further advice.  We may need to adjust your medications, re-evaluate you in the office, send you to the emergency room, or see what other things we can do to help. The clinic staff is available to answer your questions during regular business hours (8:30am-5pm).  Please don't hesitate to call and ask to speak to one of our nurses for clinical concerns.    A surgeon from [facility] is always on call at the hospitals 24 hours/day If you have a medical emergency, go to the nearest emergency room or call 911.   Discharge wound care:   Complete by:  As directed    It is good for closed incisions and even open wounds to be washed every day.  Shower every day.  Short baths are fine.  Wash the incisions and wounds clean with soap & water.     You may leave closed incisions open to air if it is dry.   You may cover the incision with clean gauze & replace it after your daily shower for comfort.   You have a drain.  Wash around the skin exit site with soap & water and place a new dressing of gauze or band aid around the skin every day.  Keep the drain site clean & dry.   Driving Restrictions   Complete by:  As directed    You may drive when: - you are no longer taking narcotic prescription pain medication - you can comfortably wear a seatbelt - you can safely make sudden turns/stops without pain.   Increase activity slowly   Complete by:  As directed    Start light daily activities --- self-care, walking, climbing stairs- beginning the day after surgery.  Gradually increase activities as tolerated.  Control your pain to be active.  Stop when you are tired.  Ideally, walk  several times a day, eventually an hour a day.   Most people are back to most day-to-day activities in a few weeks.  It takes 4-6 weeks to get back to unrestricted, intense activity. If you can walk 30 minutes without difficulty, it is safe to try more intense activity such as jogging, treadmill, bicycling, low-impact aerobics, swimming, etc. Save the most intensive and strenuous activity for last (Usually 4-8 weeks after surgery) such as sit-ups, heavy lifting, contact sports, etc.  Refrain from any intense heavy lifting or straining until you are off narcotics for pain control.  You will have off days, but things should improve week-by-week. DO NOT PUSH THROUGH PAIN.  Let pain be your guide: If it hurts to do something, don't do it.   Lifting restrictions   Complete by:  As directed    If you can walk 30 minutes without difficulty, it is safe to try more intense activity such as jogging, treadmill, bicycling, low-impact aerobics, swimming, etc. Save the most intensive and strenuous activity for last (Usually 4-8 weeks after surgery) such as sit-ups, heavy lifting, contact sports, etc.   Refrain from any intense heavy lifting or straining until you are off narcotics for pain control.  You will have off days, but things should improve week-by-week. DO NOT PUSH THROUGH PAIN.  Let pain be your guide: If it hurts to do something, don't do it.  Pain is your body warning you to avoid that activity for another week until the pain goes down.   May shower / Bathe   Complete by:  As directed    May walk up steps   Complete by:  As directed    Sexual Activity Restrictions   Complete by:  As directed    You may have sexual intercourse when it is comfortable. If it hurts to do something, stop.      Allergies as of 05/15/2018   No Known Allergies     Medication List    TAKE these medications   acetaminophen 500 MG tablet Commonly known as:  TYLENOL Take 1,000 mg by mouth daily as needed for  headache.   benazepril 20 MG tablet Commonly known as:  LOTENSIN take 1 tablet by mouth once daily What changed:    how much to take  how to take this  when to take this   ciprofloxacin 500 MG tablet Commonly known as:  CIPRO Take 1 tablet (500 mg total) by mouth 2 (two) times daily.   colchicine 0.6 MG tablet Take 0.6 mg by mouth 3 (three) times daily as needed (gout flare up.).   FIBER COMPLETE Tabs Take 1 tablet by mouth daily.   finasteride 5 MG tablet Commonly known as:  PROSCAR take 1 tablet by mouth once daily as directed What changed:    how much to take  how to take this  when to take this  additional instructions   GLUCOSAMINE 1500 COMPLEX PO Take 2 tablets by mouth daily.   metroNIDAZOLE 500 MG tablet Commonly known as:  FLAGYL Take 1 tablet (500 mg total) by mouth 2 (two) times daily.   MUCUS RELIEF 400 MG Tabs tablet Generic drug:  guaifenesin Take 400 mg by mouth every 4 (four) hours as needed (mucus).   multivitamin per tablet Take 1 tablet by mouth daily.   pravastatin 40 MG tablet Commonly known as:  PRAVACHOL take 1 tablet by mouth at bedtime            Discharge Care Instructions  (From admission, onward)        Start     Ordered   05/15/18 0000  Discharge wound care:    Comments:  It is good for closed incisions and even open wounds to be washed every day.  Shower every day.  Short baths are fine.  Wash the incisions and wounds clean with soap & water.     You may leave closed incisions open to air if it is dry.   You may cover the incision with clean gauze & replace it after your daily shower for comfort.   You have a drain.  Wash around the skin exit site with soap & water and place a new dressing of gauze or band aid around the skin every day.  Keep the drain site clean &  dry.   05/15/18 0956      Significant Diagnostic Studies:  Results for orders placed or performed during the hospital encounter of 05/14/18 (from  the past 72 hour(s))  Comprehensive metabolic panel     Status: Abnormal   Collection Time: 05/14/18  2:51 PM  Result Value Ref Range   Sodium 140 135 - 145 mmol/L   Potassium 4.9 3.5 - 5.1 mmol/L   Chloride 102 98 - 111 mmol/L    Comment: Please note change in reference range.   CO2 28 22 - 32 mmol/L   Glucose, Bld 103 (H) 70 - 99 mg/dL    Comment: Please note change in reference range.   BUN 30 (H) 8 - 23 mg/dL    Comment: Please note change in reference range.   Creatinine, Ser 1.50 (H) 0.61 - 1.24 mg/dL   Calcium 9.4 8.9 - 10.3 mg/dL   Total Protein 7.8 6.5 - 8.1 g/dL   Albumin 3.7 3.5 - 5.0 g/dL   AST 15 15 - 41 U/L   ALT 21 0 - 44 U/L    Comment: Please note change in reference range.   Alkaline Phosphatase 53 38 - 126 U/L   Total Bilirubin 0.6 0.3 - 1.2 mg/dL   GFR calc non Af Amer 45 (L) >60 mL/min   GFR calc Af Amer 52 (L) >60 mL/min    Comment: (NOTE) The eGFR has been calculated using the CKD EPI equation. This calculation has not been validated in all clinical situations. eGFR's persistently <60 mL/min signify possible Chronic Kidney Disease.    Anion gap 10 5 - 15    Comment: Performed at [facility], [place] [address] [place], [place] [address]  CBC WITH DIFFERENTIAL     Status: Abnormal   Collection Time: 05/14/18  2:51 PM  Result Value Ref Range   WBC 14.4 (H) 4.0 - 10.5 K/uL   RBC 4.52 4.22 - 5.81 MIL/uL   Hemoglobin 13.6 13.0 - 17.0 g/dL   HCT 42.7 39.0 - 52.0 %   MCV 94.5 78.0 - 100.0 fL   MCH 30.1 26.0 - 34.0 pg   MCHC 31.9 30.0 - 36.0 g/dL   RDW 14.2 11.5 - 15.5 %   Platelets 419 (H) 150 - 400 K/uL   Neutrophils Relative % 78 %   Neutro Abs 11.2 (H) 1.7 - 7.7 K/uL   Lymphocytes Relative 15 %   Lymphs Abs 2.2 0.7 - 4.0 K/uL   Monocytes Relative 6 %   Monocytes Absolute 0.9 0.1 - 1.0 K/uL   Eosinophils Relative 1 %   Eosinophils Absolute 0.2 0.0 - 0.7 K/uL   Basophils Relative 0 %   Basophils Absolute 0.0 0.0 - 0.1 K/uL     Comment: Performed at [facility], [place] [address], [place], [place] [address]  Protime-INR     Status: None   Collection Time: 05/14/18  2:52 PM  Result Value Ref Range   Prothrombin Time 13.7 11.4 - 15.2 seconds   INR 1.06     Comment: Performed at [facility], [place] [address], [place], [place] [address]  Aerobic/Anaerobic Culture (surgical/deep wound)     Status: None (Preliminary result)   Collection Time: 05/14/18  3:59 PM  Result Value Ref Range   Specimen Description      ABDOMEN ABSCESS Performed at [place] [address], [place],  [address]    Special Requests NONE    Gram  Stain      ABUNDANT WBC PRESENT, PREDOMINANTLY PMN MODERATE GRAM POSITIVE COCCI ABUNDANT GRAM NEGATIVE RODS Performed at [facility], [place] [address], [place], [place] [address]    Culture PENDING    Report Status PENDING   Basic metabolic panel     Status: Abnormal   Collection Time: 05/15/18  4:26 AM  Result Value Ref Range   Sodium 138 135 - 145 mmol/L   Potassium 4.3 3.5 - 5.1 mmol/L   Chloride 103 98 - 111 mmol/L    Comment: Please note change in reference range.   CO2 26 22 - 32 mmol/L   Glucose, Bld 116 (H) 70 - 99 mg/dL    Comment: Please note change in reference range.   BUN 34 (H) 8 - 23 mg/dL    Comment: Please note change in reference range.   Creatinine, Ser 1.47 (H) 0.61 - 1.24 mg/dL   Calcium 8.8 (L) 8.9 - 10.3 mg/dL   GFR calc non Af Amer 46 (L) >60 mL/min   GFR calc Af Amer 54 (L) >60 mL/min    Comment: (NOTE) The eGFR has been calculated using the CKD EPI equation. This calculation has not been validated in all clinical situations. eGFR's persistently <60 mL/min signify possible Chronic Kidney Disease.    Anion gap 9 5 - 15    Comment: Performed at [facility], [place] [address] [place], [place] [address]  CBC     Status: Abnormal   Collection Time: 05/15/18  4:26 AM  Result  Value Ref Range   WBC 13.4 (H) 4.0 - 10.5 K/uL   RBC 4.11 (L) 4.22 - 5.81 MIL/uL   Hemoglobin 12.3 (L) 13.0 - 17.0 g/dL   HCT 38.8 (L) 39.0 - 52.0 %   MCV 94.4 78.0 - 100.0 fL   MCH 29.9 26.0 - 34.0 pg   MCHC 31.7 30.0 - 36.0 g/dL   RDW 14.6 11.5 - 15.5 %   Platelets 416 (H) 150 - 400 K/uL    Comment: Performed at [facility], [place] [address], [place], [place] [address]    Ct Abdomen Pelvis W Contrast  Result Date: 05/14/2018 CLINICAL DATA:  Patient with right lower quadrant pain status post appendectomy 05/05/2018. EXAM: CT ABDOMEN AND PELVIS WITH CONTRAST TECHNIQUE: Multidetector CT imaging of the abdomen and pelvis was performed using the standard protocol following bolus administration of intravenous contrast. Creatinine was obtained on site at [facility] at [address] Results: Creatinine 1.5 mg/dL. CONTRAST:  43m ISOVUE-300 IOPAMIDOL (ISOVUE-300) INJECTION 61% COMPARISON:  CT abdomen pelvis 12/16/2016 FINDINGS: Lower chest: Heart is mildly enlarged. Dependent atelectasis within the bilateral lower lobes. Hepatobiliary: Fatty deposition adjacent to the gallbladder fossa. Subcentimeter too small to characterize low-attenuation lesion left hepatic lobe and right hepatic lobe. No intrahepatic or extrahepatic biliary ductal dilatation. Pancreas: Unremarkable Spleen: Unremarkable Adrenals/Urinary Tract: Normal adrenal glands. There is a 1.8 cm cyst within the superior pole of the right kidney. There is a 1.5 cm cyst superior pole left kidney. No suspicious enhancing renal masses. No hydronephrosis. Urinary bladder is unremarkable. Punctate 2 mm stone superior pole right kidney (image 86; series 3). Stomach/Bowel: Sigmoid colonic diverticulosis. No CT evidence for acute diverticulitis. Postsurgical changes compatible with recent appendectomy. Within the right lower quadrant there is a 7.5 x 4.8 cm fluid collection (image 67; series 2). No evidence for small bowel  obstruction. No free intraperitoneal air. Normal morphology of the stomach. Vascular/Lymphatic: Normal caliber  abdominal aorta. Peripheral calcified atherosclerotic plaque. No retroperitoneal lymphadenopathy. Mildly enlarged right lower quadrant mesenteric node measuring 1.2 cm (image 54; series 2), likely reactive. Reproductive: Prostate unremarkable. Other: Small fat containing right inguinal hernia. Musculoskeletal: No aggressive or acute appearing osseous lesions. Lower thoracic and lumbar spine degenerative changes. IMPRESSION: 1. There is a 7.5 cm fluid collection within the right lower quadrant at the site of prior appendectomy concerning for abscess formation. 2. No evidence for upstream small bowel obstruction. 3. These results were called by telephone at the time of interpretation on 05/14/2018 at 12:13 pm to Dr. Coralie Keens , who verbally acknowledged these results. Electronically Signed   By: Lovey Newcomer M.D.   On: 05/14/2018 12:13   Ct Image Guided Drainage By Percutaneous Catheter  Result Date: 05/14/2018 INDICATION: POSTOP ABSCESS, recent appendectomy EXAM: CT GUIDED DRAINAGE OF RIGHT LOWER QUADRANT ABSCESS MEDICATIONS: The patient is currently admitted to the hospital and receiving intravenous antibiotics. The antibiotics were administered within an appropriate time frame prior to the initiation of the procedure. ANESTHESIA/SEDATION: 2.0 mg IV Versed 100 mcg IV Fentanyl Moderate Sedation Time:  14 MINUTES The patient was continuously monitored during the procedure by the interventional radiology nurse under my direct supervision. COMPLICATIONS: None immediate. TECHNIQUE: Informed written consent was obtained from the patient after a thorough discussion of the procedural risks, benefits and alternatives. All questions were addressed. Maximal Sterile Barrier Technique was utilized including caps, mask, sterile gowns, sterile gloves, sterile drape, hand hygiene and skin antiseptic. A timeout was  performed prior to the initiation of the procedure. PROCEDURE: The right lower quadrant abdomen was prepped with Chlorhexidine in a sterile fashion, and a sterile drape was applied covering the operative field. A sterile gown and sterile gloves were used for the procedure. Local anesthesia was provided with 1% Lidocaine. Previous imaging reviewed. Patient positioned supine. Noncontrast localization CT performed. The right lower quadrant postop appendectomy abscess was localized. Overlying skin marked for a lateral approach. Under sterile conditions and local anesthesia, an 18 gauge 10 cm access needle was advanced from a lateral approach over the right iliac bone into the abscess. Needle position confirmed with CT. Syringe aspiration yielded bloody purulent fluid. Sample sent for culture. Guidewire inserted followed by tract dilatation to insert a 10 [place] drain. Drain catheter position confirmed with CT. Syringe aspiration yielded 60 cc bloody purulent fluid. Catheter secured with Prolene suture and connected to external suction bulb. Sterile dressing applied. No immediate complication. Patient tolerated the procedure well. FINDINGS: CT imaging confirms needle placed within the abscess for drain insertion IMPRESSION: Successful CT-guided right lower quadrant abscess drain insertion Electronically Signed   By: Jerilynn Mages.  Shick M.D.   On: 05/14/2018 16:37    Discharge Exam: Blood pressure 119/68, pulse 87, temperature 98.1 F (36.7 C), temperature source Oral, resp. rate 19, SpO2 97 %.  General: Pt awake/alert/oriented x4 in No acute distress.  Smiling.  Relaxed.  Chatty.  Moving around normally Eyes: PERRL, normal EOM.  Sclera clear.  No icterus Neuro: CN II-XII intact w/o focal sensory/motor deficits. Lymph: No head/neck/groin lymphadenopathy Psych:  No delerium/psychosis/paranoia HENT: Normocephalic, Mucus membranes moist.  No thrush Neck: Supple, No tracheal deviation Chest: No chest wall pain w good  excursion CV:  Pulses intact.  Regular rhythm MS: Normal AROM mjr joints.  No obvious deformity  Abdomen: Soft.  Nondistended.  Nontender.  No evidence of peritonitis.  Right lower quadrant percutaneous drain with initially bloody and somewhat brown output.  No incarcerated hernias.  Ext:  SCDs BLE.  No mjr edema.  No cyanosis Skin: No petechiae / purpura  Past Medical History:  Diagnosis Date  . Alopecia   . Diverticulosis   . Dupuytren contracture    RUE @ thumb  . Gout   . Hyperlipemia    LDL goal = < 100  . Nephrolithiasis [id number]   Dr Serita Butcher, X 2   . OSA on CPAP   . Sleep apnea    CPAP @ 11cm  . Unspecified essential hypertension 05/17/2009    Past Surgical History:  Procedure Laterality Date  . COLONOSCOPY W/ POLYPECTOMY  2005, 2007   x2 negative 10-2007, Dr Sharlett Iles   . LAPAROSCOPIC APPENDECTOMY N/A 05/05/2018   Procedure: APPENDECTOMY LAPAROSCOPIC;  Surgeon: Excell Seltzer, MD;  Location: [facility];  Service: General;  Laterality: N/A;  . renal calculus     retirieved via basket 1984; stone passed spontaneously 2011; Otic tube R TM  . WISDOM TOOTH EXTRACTION      Social History   Socioeconomic History  . Marital status: Married    Spouse name: [name]  . Number of children: 2  . Years of education: College  . Highest education level: Not on file  Occupational History  . Occupation: Curator: other  Social Needs  . Financial resource strain: Not on file  . Food insecurity:    Worry: Not on file    Inability: Not on file  . Transportation needs:    Medical: Not on file    Non-medical: Not on file  Tobacco Use  . Smoking status: Former Smoker    Last attempt to quit: 11/03/1990    Years since quitting: 27.5  . Smokeless tobacco: Former Systems developer    Types: Chew  . Tobacco comment: Quit 2005  Substance and Sexual Activity  . Alcohol use: Yes    Comment: 2 cans beer a month  . Drug use: No  . Sexual activity: Not on file  Lifestyle  .  Physical activity:    Days per week: Not on file    Minutes per session: Not on file  . Stress: Not on file  Relationships  . Social connections:    Talks on phone: Not on file    Gets together: Not on file    Attends religious service: Not on file    Active member of club or organization: Not on file    Attends meetings of clubs or organizations: Not on file    Relationship status: Not on file  . Intimate partner violence:    Fear of current or ex partner: Not on file    Emotionally abused: Not on file    Physically abused: Not on file    Forced sexual activity: Not on file  Other Topics Concern  . Not on file  Social History Narrative   Patient is married [name]) and lives at home with his wife.   Patient owns [employer].   Patient has a college education.   Patient has two sons and four grandchildren.   Patient is right-handed.   Patient drinks coffee, Diet cokes about four + a week.                Family History  Problem Relation Age of Onset  . Cancer Father        LUNG ,THROAT  . Esophageal cancer Father   . Hypertension Maternal Grandmother   . Stroke Maternal Grandmother 72  .  Diabetes Maternal Grandmother   . Heart attack Paternal Uncle        X2, neither pre 55  . Heart attack Maternal Uncle        not pre 55  . Lung cancer Maternal Grandfather     Current Facility-Administered Medications  Medication Dose Route Frequency Provider Last Rate Last Dose  . 0.9 %  sodium chloride infusion  250 mL Intravenous PRN Michael Boston, MD      . acetaminophen (TYLENOL) tablet 650 mg  650 mg Oral Q6H PRN Rayburn, Kelly A, PA-C   650 mg at 05/15/18 0515   Or  . acetaminophen (TYLENOL) suppository 650 mg  650 mg Rectal Q6H PRN Rayburn, Kelly A, PA-C      . alum & mag hydroxide-simeth (MAALOX/MYLANTA) 200-200-20 MG/5ML suspension 30 mL  30 mL Oral Q6H PRN Michael Boston, MD      . benazepril (LOTENSIN) tablet 20 mg  20 mg Oral Daily Rayburn, Kelly A,  PA-C   20 mg at 05/15/18 0951  . bisacodyl (DULCOLAX) suppository 10 mg  10 mg Rectal Q12H PRN Michael Boston, MD      . colchicine tablet 0.6 mg  0.6 mg Oral BID PRN Rolm Bookbinder, MD   0.6 mg at 05/15/18 0953  . enoxaparin (LOVENOX) injection 40 mg  40 mg Subcutaneous Q24H Rayburn, Kelly A, PA-C   40 mg at 05/15/18 0952  . finasteride (PROSCAR) tablet 2.5 mg  2.5 mg Oral QODAY Rayburn, Kelly A, PA-C      . guaiFENesin (ROBITUSSIN) 100 MG/5ML solution 400 mg  400 mg Oral Q4H PRN Michael Boston, MD      . hydrocortisone (ANUSOL-HC) 2.5 % rectal cream 1 application  1 application Topical QID PRN Michael Boston, MD      . hydrocortisone cream 1 % 1 application  1 application Topical TID PRN Michael Boston, MD      . lactated ringers bolus 1,000 mL  1,000 mL Intravenous TID PRN Michael Boston, MD      . lip balm (CARMEX) ointment 1 application  1 application Topical BID Michael Boston, MD   1 application at 30/86/57 [phone]  . magic mouthwash  15 mL Oral QID PRN Michael Boston, MD      . menthol-cetylpyridinium (CEPACOL) lozenge 3 mg  1 lozenge Oral PRN Michael Boston, MD      . methocarbamol (ROBAXIN) 1,000 mg in dextrose 5 % 50 mL IVPB  1,000 mg Intravenous Q6H PRN Michael Boston, MD      . metoprolol tartrate (LOPRESSOR) injection 5 mg  5 mg Intravenous Q6H PRN Rayburn, Kelly A, PA-C      . morphine 2 MG/ML injection 2 mg  2 mg Intravenous Q3H PRN Rayburn, Kelly A, PA-C      . multivitamin with minerals tablet 1 tablet  1 tablet Oral Daily Michael Boston, MD      . ondansetron (ZOFRAN-ODT) disintegrating tablet 4 mg  4 mg Oral Q6H PRN Rayburn, Kelly A, PA-C       Or  . ondansetron (ZOFRAN) injection 4 mg  4 mg Intravenous Q6H PRN Rayburn, Kelly A, PA-C      . pantoprazole (PROTONIX) EC tablet 40 mg  40 mg Oral Daily Rayburn, Kelly A, PA-C   40 mg at 05/15/18 0952  . phenol (CHLORASEPTIC) mouth spray 1-2 spray  1-2 spray Mouth/Throat PRN Michael Boston, MD      . piperacillin-tazobactam (ZOSYN) IVPB 3.375  g  3.375 g Intravenous Q8H  Rayburn, Floyce Stakes, PA-C   Stopped at 05/15/18 [phone]  . polycarbophil (FIBERCON) tablet 625 mg  625 mg Oral Daily Jayvin Hurrell, Remo Lipps, MD      . polyethylene glycol (MIRALAX / GLYCOLAX) packet 17 g  17 g Oral Q12H PRN Michael Boston, MD      . pravastatin (PRAVACHOL) tablet 40 mg  40 mg Oral Daily Rayburn, Kelly A, PA-C   40 mg at 05/14/18 1651  . saccharomyces boulardii (FLORASTOR) capsule 250 mg  250 mg Oral BID Michael Boston, MD      . sodium chloride flush (NS) 0.9 % injection 3 mL  3 mL Intravenous Gorden Harms, MD      . sodium chloride flush (NS) 0.9 % injection 3 mL  3 mL Intravenous PRN Michael Boston, MD      . sodium chloride flush (NS) 0.9 % injection 5 mL  5 mL Intracatheter Q8H Greggory Keen, MD   5 mL at 05/14/18 1652  . traMADol (ULTRAM) tablet 50 mg  50 mg Oral Q6H PRN Rayburn, Kelly A, PA-C   50 mg at 05/14/18 1832     No Known Allergies  Signed: Morton Peters, MD, FACS, MASCRS [facility]    [address], [place], [place] [address] [phone] Main / Paging [phone] Fax   05/15/2018, 9:56 AM

## 2018-05-18 ENCOUNTER — Other Ambulatory Visit: Payer: Self-pay | Admitting: Surgery

## 2018-05-18 DIAGNOSIS — K651 Peritoneal abscess: Secondary | ICD-10-CM

## 2018-05-18 DIAGNOSIS — Z9049 Acquired absence of other specified parts of digestive tract: Secondary | ICD-10-CM | POA: Diagnosis not present

## 2018-05-20 LAB — AEROBIC/ANAEROBIC CULTURE W GRAM STAIN (SURGICAL/DEEP WOUND)

## 2018-05-20 LAB — AEROBIC/ANAEROBIC CULTURE (SURGICAL/DEEP WOUND)

## 2018-05-27 ENCOUNTER — Other Ambulatory Visit: Payer: Medicare Other

## 2018-06-01 ENCOUNTER — Ambulatory Visit
Admission: RE | Admit: 2018-06-01 | Discharge: 2018-06-01 | Disposition: A | Discharge status: Home / Self Care | Payer: Medicare Other | Source: Ambulatory Visit | Attending: Surgery | Admitting: Surgery

## 2018-06-01 ENCOUNTER — Encounter: Payer: Self-pay | Admitting: Radiology

## 2018-06-01 DIAGNOSIS — K651 Peritoneal abscess: Secondary | ICD-10-CM

## 2018-06-01 DIAGNOSIS — N281 Cyst of kidney, acquired: Secondary | ICD-10-CM | POA: Diagnosis not present

## 2018-06-01 HISTORY — PX: IR RADIOLOGIST EVAL & MGMT: IMG5224

## 2018-06-01 MED ORDER — IOPAMIDOL (ISOVUE-300) INJECTION 61%
100.0000 mL | Freq: Once | INTRAVENOUS | Status: AC | PRN
  Administered 2018-06-01: 100 mL via INTRAVENOUS

## 2018-06-01 NOTE — Progress Notes (Signed)
Interventional Radiology Progress Note  72 yo male with a history of RLQ abscess.  CT drainage was performed as inpt.    He has done well after dc home, with no further drainage.  He denies any fever.  He is no longer flushing the drain.   CT today shows no residual collection.    Fluoro guided injection shows no abscess, with no fistula.    Seems reasonable to remove the drain at this point.  He has an appointment this Friday with surgery, and as long as surgery team agrees, removal is indicated.  VIR is happy to help remove, or can be removed after upcoming appointment.  If his surgery team feels that the drain should stay further, VIR may see again in the interval with any repeat imaging deemed necessary.     Signed,  Dulcy Fanny. Earleen Newport, DO

## 2018-07-14 DIAGNOSIS — M109 Gout, unspecified: Secondary | ICD-10-CM | POA: Diagnosis not present

## 2018-07-14 DIAGNOSIS — E7849 Other hyperlipidemia: Secondary | ICD-10-CM | POA: Diagnosis not present

## 2018-07-14 DIAGNOSIS — I10 Essential (primary) hypertension: Secondary | ICD-10-CM | POA: Diagnosis not present

## 2018-07-14 DIAGNOSIS — R7301 Impaired fasting glucose: Secondary | ICD-10-CM | POA: Diagnosis not present

## 2018-07-14 DIAGNOSIS — R82998 Other abnormal findings in urine: Secondary | ICD-10-CM | POA: Diagnosis not present

## 2018-07-14 DIAGNOSIS — Z125 Encounter for screening for malignant neoplasm of prostate: Secondary | ICD-10-CM | POA: Diagnosis not present

## 2018-07-21 DIAGNOSIS — L821 Other seborrheic keratosis: Secondary | ICD-10-CM | POA: Diagnosis not present

## 2018-07-21 DIAGNOSIS — L570 Actinic keratosis: Secondary | ICD-10-CM | POA: Diagnosis not present

## 2018-07-21 DIAGNOSIS — N183 Chronic kidney disease, stage 3 (moderate): Secondary | ICD-10-CM | POA: Diagnosis not present

## 2018-07-21 DIAGNOSIS — I129 Hypertensive chronic kidney disease with stage 1 through stage 4 chronic kidney disease, or unspecified chronic kidney disease: Secondary | ICD-10-CM | POA: Diagnosis not present

## 2018-07-21 DIAGNOSIS — Z0000 Encounter for general adult medical examination without abnormal findings: Secondary | ICD-10-CM | POA: Diagnosis not present

## 2018-07-21 DIAGNOSIS — Z85828 Personal history of other malignant neoplasm of skin: Secondary | ICD-10-CM | POA: Diagnosis not present

## 2018-07-21 DIAGNOSIS — L820 Inflamed seborrheic keratosis: Secondary | ICD-10-CM | POA: Diagnosis not present

## 2018-07-21 DIAGNOSIS — G6289 Other specified polyneuropathies: Secondary | ICD-10-CM | POA: Diagnosis not present

## 2018-07-21 DIAGNOSIS — R05 Cough: Secondary | ICD-10-CM | POA: Diagnosis not present

## 2018-07-21 DIAGNOSIS — E7849 Other hyperlipidemia: Secondary | ICD-10-CM | POA: Diagnosis not present

## 2018-07-21 DIAGNOSIS — L720 Epidermal cyst: Secondary | ICD-10-CM | POA: Diagnosis not present

## 2018-07-21 DIAGNOSIS — Z1389 Encounter for screening for other disorder: Secondary | ICD-10-CM | POA: Diagnosis not present

## 2018-07-21 DIAGNOSIS — M109 Gout, unspecified: Secondary | ICD-10-CM | POA: Diagnosis not present

## 2018-07-21 DIAGNOSIS — Z6832 Body mass index (BMI) 32.0-32.9, adult: Secondary | ICD-10-CM | POA: Diagnosis not present

## 2018-07-21 DIAGNOSIS — R7301 Impaired fasting glucose: Secondary | ICD-10-CM | POA: Diagnosis not present

## 2018-07-21 DIAGNOSIS — I10 Essential (primary) hypertension: Secondary | ICD-10-CM | POA: Diagnosis not present

## 2018-07-21 DIAGNOSIS — G4733 Obstructive sleep apnea (adult) (pediatric): Secondary | ICD-10-CM | POA: Diagnosis not present

## 2018-07-29 DIAGNOSIS — Z1212 Encounter for screening for malignant neoplasm of rectum: Secondary | ICD-10-CM | POA: Diagnosis not present

## 2018-08-11 ENCOUNTER — Ambulatory Visit (INDEPENDENT_AMBULATORY_CARE_PROVIDER_SITE_OTHER): Payer: Medicare Other

## 2018-08-11 ENCOUNTER — Ambulatory Visit (INDEPENDENT_AMBULATORY_CARE_PROVIDER_SITE_OTHER): Payer: Medicare Other | Admitting: Podiatry

## 2018-08-11 VITALS — BP 131/67 | HR 81

## 2018-08-11 DIAGNOSIS — G5763 Lesion of plantar nerve, bilateral lower limbs: Secondary | ICD-10-CM

## 2018-08-11 DIAGNOSIS — M779 Enthesopathy, unspecified: Secondary | ICD-10-CM | POA: Diagnosis not present

## 2018-08-11 DIAGNOSIS — M778 Other enthesopathies, not elsewhere classified: Secondary | ICD-10-CM

## 2018-08-14 NOTE — Progress Notes (Signed)
   HPI: 72 year old male presenting today as a new patient with a chief complaint of pain to the balls of bilateral feet that began a few months ago. He states the pain is now radiating to the toes. Walking increases the pain. He has not done anything to treat the symptoms. Patient is here for further evaluation and treatment.   Past Medical History:  Diagnosis Date  . Alopecia   . Diverticulosis   . Dupuytren contracture    RUE @ thumb  . Gout   . Hyperlipemia    LDL goal = < 100  . Nephrolithiasis [id number]   Dr Serita Butcher, X 2   . OSA on CPAP   . Sleep apnea    CPAP @ 11cm  . Unspecified essential hypertension 05/17/2009      Physical Exam: General: The patient is alert and oriented x3 in no acute distress.  Dermatology: Skin is warm, dry and supple bilateral lower extremities. Negative for open lesions or macerations.  Vascular: Palpable pedal pulses bilaterally. No edema or erythema noted. Capillary refill within normal limits.  Neurological: Epicritic and protective threshold grossly intact bilaterally.   Musculoskeletal Exam: Sharp pain with palpation of the 3rd interspace and lateral compression of the metatarsal heads consistent with neuroma. Positive Conley Canal sign with loadbearing of the forefoot.  Radiographic Exam:  Normal osseous mineralization. Joint spaces preserved. No fracture/dislocation/boney destruction.    Assessment: 1.  Morton's neuroma 3rd interspace bilateral feet   Plan of Care:  1. Patient was evaluated. X-Rays reviewed.  2. Offloading met pads dispensed.  3. Recommended good shoe gear.  4. Return to clinic as needed.    Edrick Kins, DPM [facility]  Dr. Edrick Kins, [address]                                        [place], [place] [address]                Office [phone]  Fax [phone]

## 2018-09-15 ENCOUNTER — Encounter: Payer: Self-pay | Admitting: Neurology

## 2018-09-21 ENCOUNTER — Encounter: Payer: Self-pay | Admitting: Neurology

## 2018-09-21 ENCOUNTER — Ambulatory Visit (INDEPENDENT_AMBULATORY_CARE_PROVIDER_SITE_OTHER): Payer: Medicare Other | Admitting: Neurology

## 2018-09-21 VITALS — BP 130/78 | HR 85 | Ht 71.0 in | Wt 230.0 lb

## 2018-09-21 DIAGNOSIS — G4733 Obstructive sleep apnea (adult) (pediatric): Secondary | ICD-10-CM

## 2018-09-21 NOTE — Patient Instructions (Signed)
Follow up with Np or me in 12 month,   Larey Seat, MD

## 2018-09-21 NOTE — Progress Notes (Signed)
[facility]   Provider:  Larey Seat, MD   Referring Provider: Marton Redwood, MD Primary Care Physician:  Marton Redwood, MD  Chief Complaint  Patient presents with  . Follow-up    pt alone, rm 11. pt states that things are going well with machine. DME [facility]    HPI:  Camila Norville is a 72 y.o. male patient is seen here for a yearly revisit with compliance of CPAP in the treatment of sleep apnea.   09-21-2018 high compliance on CPAP, god resolution. He lost 16 pounds with an appendectomy July 2019.   He never had the classic symptoms of an appendicitis, he developed dry heaves and only after repeated palpation of the abdomen was a trigger point found, it was not a classical McBurney spot.  He never have the typical pain, but he developed low-grade continued fevers after appendectomy and had developed an abscess.  As to his CPAP compliance was 83% for the last 30 days with an average daily usage time of 5 hours 48 minutes, AutoSet between 6 and 11 cmH2O is used with 1 cm EPR, AHI was 0.5/h 95th percentile pressure is 10.9 cmH2O he has minimal air leakage, he appears neither depressed nor excessively daytime sleepy Epworth sleepiness score was endorsed at 7 points and the fatigue severity scale at 16 points.  He has his second (and older CPAP )  machine at his [place]. He needs a new ESON headgear and mask in M. His older machine is set on CM 11.   Interval history from 16 September 2017. This is a regular routine yearly follow-up for CPAP compliance with Mr. Giovanni Biby.  He has been a compliant CPAP user, alternating between CPAP machines and his second home in his primary home.  Together these reports would shortness of a 97% compliance for hours and 100% 4 days.  Average use at time is 7 hours and 5 minutes.  Residual AHI is 0.4 which is an excellent result.  95th percentile pressure is 10.9 cm water, there are no treatment emergent central  apneas noted.  There are no changes necessary.  The patient received supplies for his CPAP through [facility]. The  Last prescribed machine is 7.72 years old, the older one is 72 years old, and he is using CPAP for over 20 years.He reports that one night when he forgot to bring his CPAP with him he felt his airway collapsing and actually caused him to panic. He is a very compliant CPAP user.    Consultation note: CD . [name] states that he was diagnosed with obstructive sleep apnea is so many years ago he doesn't really know when exactly the diagnosis was made. Dr. Claiborne Rigg ENT- physician had sent him to [facility] hospital about 18 years ago for a sleep study, he was given the CPAP after this evaluation. Dr. Linna Darner  PCP followed him at the time.  He had [facility] as his DME, but he couldn't get any new masks after not having seen a physician in so many years.  He never had physician follow up in regards to CPAP.  The patient has been using an 11 cm water pressure setting as long as he can remember. He feels that the CPAP is working fine for him , but needs his new equipment. He cannot sleep without his machine. He snores loudly without his machine.   Bedtime is 11 Pm and he will fall asleep promptly ( '  in about 2 minutes '), he wakes once a night for a bathroom break and sleeps through until 7 AM, spontaneously, no alarm is needed.  During the day , he drinks diet cokes, no coffee.  ETOH intake is 2 beers a week, and no tobacco use.  He doesn't nap. He does have firearms in the house- [place], those a locked up.   08-15-15 compliance download;  the patient has used a CPAP 73% with over 4 hours of consecutive nightly use. The average use is 4 hours and 54 minutes. He regularly does not use this CPAP on Sunday nights, but she spends at his [place]. He has another CPAP machine at that location his overall compliance from both machines is well above 90%. His current machine is set between 6 and 11  cm pressure window was 1 cm expiratory rash or relief or EPR. [place] machine is set at 11 cm water pressure the 95th percentile pressure for this I will total said machine is 10.9. He is doing well with that and has a residual AHI of only 0.4 - no adjustments have to be made. The patient will receive new supplies,  he actually already received some last week from [facility].   He had no falls in the last year, he has not had an elevation of depression he keeps an active lifestyle. He likes to deer hunt. Non smoker, Non drinker.  His wife has had recently some health scare and is currently in rehabilitation working on her ambulation and endurance.  Interval history from 09/16/2016, I had the pleasure of seeing [name] today whom I have followed for over 3 years now regularly on CPAP and to has an established diagnosis of obstructive sleep apnea for well over 20 years. He is currently using an AutoSet between 6 and 11 cm water was 1 cm EPR, residual AHI is 0.3. He alternates between 2 different CPAP machines averaging usually 7 hours and 3 minutes user time each night. I was able to review the compliance record of his normal machines which would give him a compliance of 70% however he is using another machine on weekends at the second home. He endorsed no significant daytime sleepiness, his fatigue severity scale  score was 19 points,  Epworth sleepiness score 3 points and to so-called GE reactive depression score was 1/15 points. His wife had 4 major surgeries in 4 years and has struggled with health. The couple likes to travel.   Review of Systems:  Out of a complete 14 system review, the patient complains of only the following symptoms, and all other reviewed systems are negative.  He endorsed no significant daytime sleepiness, his fatigue severity scale score was endorsed at 6/ 63  points, Epworth sleepiness score 7 points and to  points.  No nocturia, no snoring on CPAP- needs new  headgear ( ESON Medium )  , tube and clip.    Social History   Socioeconomic History  . Marital status: Married    Spouse name: [name]  . Number of children: 2  . Years of education: College  . Highest education level: Not on file  Occupational History  . Occupation: Curator: other  Social Needs  . Financial resource strain: Not on file  . Food insecurity:    Worry: Not on file    Inability: Not on file  . Transportation needs:    Medical: Not on file    Non-medical:  Not on file  Tobacco Use  . Smoking status: Former Smoker    Last attempt to quit: 11/03/1990    Years since quitting: 27.9  . Smokeless tobacco: Former Systems developer    Types: Chew  . Tobacco comment: Quit 2005  Substance and Sexual Activity  . Alcohol use: Yes    Comment: 2 cans beer a month  . Drug use: No  . Sexual activity: Not on file  Lifestyle  . Physical activity:    Days per week: Not on file    Minutes per session: Not on file  . Stress: Not on file  Relationships  . Social connections:    Talks on phone: Not on file    Gets together: Not on file    Attends religious service: Not on file    Active member of club or organization: Not on file    Attends meetings of clubs or organizations: Not on file    Relationship status: Not on file  . Intimate partner violence:    Fear of current or ex partner: Not on file    Emotionally abused: Not on file    Physically abused: Not on file    Forced sexual activity: Not on file  Other Topics Concern  . Not on file  Social History Narrative   Patient is married [name]) and lives at home with his wife.   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   Patient has a college education.   Patient has two sons and four grandchildren.   Patient is right-handed.   Patient drinks coffee, Diet cokes about four + a week.                Family History  Problem Relation Age of Onset  . Cancer Father        LUNG ,THROAT  . Esophageal cancer Father    . Hypertension Maternal Grandmother   . Stroke Maternal Grandmother 72  . Diabetes Maternal Grandmother   . Heart attack Paternal Uncle        X2, neither pre 55  . Heart attack Maternal Uncle        not pre 55  . Lung cancer Maternal Grandfather     Past Medical History:  Diagnosis Date  . Alopecia   . Diverticulosis   . Dupuytren contracture    RUE @ thumb  . Gout   . Hyperlipemia    LDL goal = < 100  . Nephrolithiasis [id number]   Dr Serita Butcher, X 2   . OSA on CPAP   . Sleep apnea    CPAP @ 11cm  . Unspecified essential hypertension 05/17/2009    Past Surgical History:  Procedure Laterality Date  . COLONOSCOPY W/ POLYPECTOMY  2005, 2007   x2 negative 10-2007, Dr Sharlett Iles   . IR RADIOLOGIST EVAL & MGMT  06/01/2018  . LAPAROSCOPIC APPENDECTOMY N/A 05/05/2018   Procedure: APPENDECTOMY LAPAROSCOPIC;  Surgeon: Excell Seltzer, MD;  Location: [facility]S;  Service: General;  Laterality: N/A;  . renal calculus     retirieved via basket 1984; stone passed spontaneously 2011; Otic tube R TM  . WISDOM TOOTH EXTRACTION      Current Outpatient Medications  Medication Sig Dispense Refill  . acetaminophen (TYLENOL) 500 MG tablet Take 1,000 mg by mouth daily as needed for headache.    . ciprofloxacin (CIPRO) 500 MG tablet Take 1 tablet (500 mg total) by mouth 2 (two) times daily. 20 tablet 1  . colchicine 0.6 MG  tablet Take 0.6 mg by mouth 3 (three) times daily as needed (gout flare up.).    Marland Kitchen FIBER COMPLETE TABS Take 1 tablet by mouth daily.     . finasteride (PROSCAR) 5 MG tablet take 1 tablet by mouth once daily as directed (Patient taking differently: Take 2.5 mg tablet by mouth daily.) 30 tablet 1  . Glucosamine-Chondroit-Vit C-Mn (GLUCOSAMINE 1500 COMPLEX PO) Take 2 tablets by mouth daily.    Marland Kitchen guaifenesin (MUCUS RELIEF) 400 MG TABS tablet Take 400 mg by mouth every 4 (four) hours as needed (mucus).    . metroNIDAZOLE (FLAGYL) 500 MG tablet Take 1 tablet (500 mg total) by mouth 2  (two) times daily. 20 tablet 1  . multivitamin (THERAGRAN) per tablet Take 1 tablet by mouth daily.      Marland Kitchen olmesartan (BENICAR) 20 MG tablet     . pravastatin (PRAVACHOL) 40 MG tablet take 1 tablet by mouth at bedtime 90 tablet 1   No current facility-administered medications for this visit.     Allergies as of 09/21/2018  . (No Known Allergies)    Vitals: BP 130/78   Pulse 85   Ht 5\' 11"  (1.803 m)   Wt 230 lb (104.3 kg)   BMI 32.08 kg/m  Last Weight:  Wt Readings from Last 1 Encounters:  09/21/18 230 lb (104.3 kg)   Last Height:   Ht Readings from Last 1 Encounters:  09/21/18 5\' 11"  (1.803 m)    Physical exam:  General: The patient is awake, alert and appears not in acute distress. The patient is well groomed. Head: Normocephalic, atraumatic. Neck is supple. Mallampati 3, very low and narrow airway, neck circumference: 19 inches. Nasal congestions.  Cardiovascular: Regular rate and rhythm, without murmurs or carotid bruit, and without distended neck veins. Respiratory: Lungs are clear to auscultation. Skin:  Without evidence of edema, or rash Trunk: BMI is elevated and patient has normal posture.  Neurologic exam : The patient is awake and alert, oriented to place and time. Memory subjective described as intact.  There is a normal attention span & concentration ability. Speech is fluent without dysarthria, dysphonia or aphasia. Mood and affect are appropriate. Cranial nerves:Pupils are equal and briskly reactive to light. Visual fields by finger perimetry are intact. Hearing to finger rub intact.  Facial sensation intact to fine touch. Facial motor strength is symmetric and tongue and uvula move midline. Motor exam: Normal tone, muscle bulk and symmetric normal strength in all extremities.  Assessment:  After physical and neurologic examination, review of laboratory studies, imaging, neurophysiology testing and pre-existing records, assessment is   1) Patient with a 20  plus year history of apnea. On CPAP, highly compiant but alternating between 2 machines, one at his [place] .  Currently happy CPAP user on  11 cm water setting for his older machine ( at his second home) and  A newer CPAP auto-set as at home machine, 83% -with a residual AHI of 0. 4. The patient uses an AutoSet between 6 and 11 cm water was 1 cm EPR. He does have minor air leaks. He does have an older machine at the second home and therefore the compliance is 100% on merged records - from both CPAP's.   Plan:  Treatment plan and additional workup :  1)  Keep using CPAP !    RV in 12 month. Keep up the good compliance.     Larey Seat, MD   Cc: Dr. Carmie Kanner .

## 2018-12-03 DIAGNOSIS — M545 Low back pain: Secondary | ICD-10-CM | POA: Diagnosis not present

## 2018-12-03 DIAGNOSIS — N183 Chronic kidney disease, stage 3 (moderate): Secondary | ICD-10-CM | POA: Diagnosis not present

## 2018-12-03 DIAGNOSIS — M5416 Radiculopathy, lumbar region: Secondary | ICD-10-CM | POA: Diagnosis not present

## 2018-12-08 DIAGNOSIS — M9903 Segmental and somatic dysfunction of lumbar region: Secondary | ICD-10-CM | POA: Diagnosis not present

## 2018-12-08 DIAGNOSIS — M9905 Segmental and somatic dysfunction of pelvic region: Secondary | ICD-10-CM | POA: Diagnosis not present

## 2018-12-08 DIAGNOSIS — M4036 Flatback syndrome, lumbar region: Secondary | ICD-10-CM | POA: Diagnosis not present

## 2018-12-08 DIAGNOSIS — Q72812 Congenital shortening of left lower limb: Secondary | ICD-10-CM | POA: Diagnosis not present

## 2018-12-09 DIAGNOSIS — Q72812 Congenital shortening of left lower limb: Secondary | ICD-10-CM | POA: Diagnosis not present

## 2018-12-09 DIAGNOSIS — M4036 Flatback syndrome, lumbar region: Secondary | ICD-10-CM | POA: Diagnosis not present

## 2018-12-09 DIAGNOSIS — M9903 Segmental and somatic dysfunction of lumbar region: Secondary | ICD-10-CM | POA: Diagnosis not present

## 2018-12-09 DIAGNOSIS — M9905 Segmental and somatic dysfunction of pelvic region: Secondary | ICD-10-CM | POA: Diagnosis not present

## 2018-12-14 DIAGNOSIS — Q72812 Congenital shortening of left lower limb: Secondary | ICD-10-CM | POA: Diagnosis not present

## 2018-12-14 DIAGNOSIS — M9903 Segmental and somatic dysfunction of lumbar region: Secondary | ICD-10-CM | POA: Diagnosis not present

## 2018-12-14 DIAGNOSIS — M9905 Segmental and somatic dysfunction of pelvic region: Secondary | ICD-10-CM | POA: Diagnosis not present

## 2018-12-14 DIAGNOSIS — M4036 Flatback syndrome, lumbar region: Secondary | ICD-10-CM | POA: Diagnosis not present

## 2018-12-15 DIAGNOSIS — M4036 Flatback syndrome, lumbar region: Secondary | ICD-10-CM | POA: Diagnosis not present

## 2018-12-15 DIAGNOSIS — Q72812 Congenital shortening of left lower limb: Secondary | ICD-10-CM | POA: Diagnosis not present

## 2018-12-15 DIAGNOSIS — M9905 Segmental and somatic dysfunction of pelvic region: Secondary | ICD-10-CM | POA: Diagnosis not present

## 2018-12-15 DIAGNOSIS — M9903 Segmental and somatic dysfunction of lumbar region: Secondary | ICD-10-CM | POA: Diagnosis not present

## 2018-12-21 DIAGNOSIS — M9905 Segmental and somatic dysfunction of pelvic region: Secondary | ICD-10-CM | POA: Diagnosis not present

## 2018-12-21 DIAGNOSIS — Q72812 Congenital shortening of left lower limb: Secondary | ICD-10-CM | POA: Diagnosis not present

## 2018-12-21 DIAGNOSIS — M9903 Segmental and somatic dysfunction of lumbar region: Secondary | ICD-10-CM | POA: Diagnosis not present

## 2018-12-21 DIAGNOSIS — M4036 Flatback syndrome, lumbar region: Secondary | ICD-10-CM | POA: Diagnosis not present

## 2018-12-22 DIAGNOSIS — M9905 Segmental and somatic dysfunction of pelvic region: Secondary | ICD-10-CM | POA: Diagnosis not present

## 2018-12-22 DIAGNOSIS — M4036 Flatback syndrome, lumbar region: Secondary | ICD-10-CM | POA: Diagnosis not present

## 2018-12-22 DIAGNOSIS — Q72812 Congenital shortening of left lower limb: Secondary | ICD-10-CM | POA: Diagnosis not present

## 2018-12-22 DIAGNOSIS — M9903 Segmental and somatic dysfunction of lumbar region: Secondary | ICD-10-CM | POA: Diagnosis not present

## 2018-12-23 DIAGNOSIS — M4036 Flatback syndrome, lumbar region: Secondary | ICD-10-CM | POA: Diagnosis not present

## 2018-12-23 DIAGNOSIS — M9903 Segmental and somatic dysfunction of lumbar region: Secondary | ICD-10-CM | POA: Diagnosis not present

## 2018-12-23 DIAGNOSIS — M9905 Segmental and somatic dysfunction of pelvic region: Secondary | ICD-10-CM | POA: Diagnosis not present

## 2018-12-23 DIAGNOSIS — Q72812 Congenital shortening of left lower limb: Secondary | ICD-10-CM | POA: Diagnosis not present

## 2018-12-28 DIAGNOSIS — M4036 Flatback syndrome, lumbar region: Secondary | ICD-10-CM | POA: Diagnosis not present

## 2018-12-28 DIAGNOSIS — M9903 Segmental and somatic dysfunction of lumbar region: Secondary | ICD-10-CM | POA: Diagnosis not present

## 2018-12-28 DIAGNOSIS — Q72812 Congenital shortening of left lower limb: Secondary | ICD-10-CM | POA: Diagnosis not present

## 2018-12-28 DIAGNOSIS — M9905 Segmental and somatic dysfunction of pelvic region: Secondary | ICD-10-CM | POA: Diagnosis not present

## 2018-12-30 DIAGNOSIS — M9905 Segmental and somatic dysfunction of pelvic region: Secondary | ICD-10-CM | POA: Diagnosis not present

## 2018-12-30 DIAGNOSIS — M9903 Segmental and somatic dysfunction of lumbar region: Secondary | ICD-10-CM | POA: Diagnosis not present

## 2018-12-30 DIAGNOSIS — Q72812 Congenital shortening of left lower limb: Secondary | ICD-10-CM | POA: Diagnosis not present

## 2018-12-30 DIAGNOSIS — M4036 Flatback syndrome, lumbar region: Secondary | ICD-10-CM | POA: Diagnosis not present

## 2019-01-03 DIAGNOSIS — M9903 Segmental and somatic dysfunction of lumbar region: Secondary | ICD-10-CM | POA: Diagnosis not present

## 2019-01-03 DIAGNOSIS — M9905 Segmental and somatic dysfunction of pelvic region: Secondary | ICD-10-CM | POA: Diagnosis not present

## 2019-01-03 DIAGNOSIS — Q72812 Congenital shortening of left lower limb: Secondary | ICD-10-CM | POA: Diagnosis not present

## 2019-01-03 DIAGNOSIS — M4036 Flatback syndrome, lumbar region: Secondary | ICD-10-CM | POA: Diagnosis not present

## 2019-01-04 DIAGNOSIS — Q72812 Congenital shortening of left lower limb: Secondary | ICD-10-CM | POA: Diagnosis not present

## 2019-01-04 DIAGNOSIS — M9903 Segmental and somatic dysfunction of lumbar region: Secondary | ICD-10-CM | POA: Diagnosis not present

## 2019-01-04 DIAGNOSIS — M9905 Segmental and somatic dysfunction of pelvic region: Secondary | ICD-10-CM | POA: Diagnosis not present

## 2019-01-04 DIAGNOSIS — M4036 Flatback syndrome, lumbar region: Secondary | ICD-10-CM | POA: Diagnosis not present

## 2019-01-07 DIAGNOSIS — M4036 Flatback syndrome, lumbar region: Secondary | ICD-10-CM | POA: Diagnosis not present

## 2019-01-07 DIAGNOSIS — M9903 Segmental and somatic dysfunction of lumbar region: Secondary | ICD-10-CM | POA: Diagnosis not present

## 2019-01-07 DIAGNOSIS — Q72812 Congenital shortening of left lower limb: Secondary | ICD-10-CM | POA: Diagnosis not present

## 2019-01-07 DIAGNOSIS — M9905 Segmental and somatic dysfunction of pelvic region: Secondary | ICD-10-CM | POA: Diagnosis not present

## 2019-01-11 DIAGNOSIS — M9903 Segmental and somatic dysfunction of lumbar region: Secondary | ICD-10-CM | POA: Diagnosis not present

## 2019-01-11 DIAGNOSIS — Q72812 Congenital shortening of left lower limb: Secondary | ICD-10-CM | POA: Diagnosis not present

## 2019-01-11 DIAGNOSIS — M4036 Flatback syndrome, lumbar region: Secondary | ICD-10-CM | POA: Diagnosis not present

## 2019-01-11 DIAGNOSIS — M9905 Segmental and somatic dysfunction of pelvic region: Secondary | ICD-10-CM | POA: Diagnosis not present

## 2019-01-12 DIAGNOSIS — M9903 Segmental and somatic dysfunction of lumbar region: Secondary | ICD-10-CM | POA: Diagnosis not present

## 2019-01-12 DIAGNOSIS — Q72812 Congenital shortening of left lower limb: Secondary | ICD-10-CM | POA: Diagnosis not present

## 2019-01-12 DIAGNOSIS — M4036 Flatback syndrome, lumbar region: Secondary | ICD-10-CM | POA: Diagnosis not present

## 2019-01-12 DIAGNOSIS — M9905 Segmental and somatic dysfunction of pelvic region: Secondary | ICD-10-CM | POA: Diagnosis not present

## 2019-01-14 DIAGNOSIS — M9903 Segmental and somatic dysfunction of lumbar region: Secondary | ICD-10-CM | POA: Diagnosis not present

## 2019-01-14 DIAGNOSIS — M9905 Segmental and somatic dysfunction of pelvic region: Secondary | ICD-10-CM | POA: Diagnosis not present

## 2019-01-14 DIAGNOSIS — M4036 Flatback syndrome, lumbar region: Secondary | ICD-10-CM | POA: Diagnosis not present

## 2019-01-14 DIAGNOSIS — Q72812 Congenital shortening of left lower limb: Secondary | ICD-10-CM | POA: Diagnosis not present

## 2019-02-03 DIAGNOSIS — M9905 Segmental and somatic dysfunction of pelvic region: Secondary | ICD-10-CM | POA: Diagnosis not present

## 2019-02-03 DIAGNOSIS — Q72812 Congenital shortening of left lower limb: Secondary | ICD-10-CM | POA: Diagnosis not present

## 2019-02-03 DIAGNOSIS — M9903 Segmental and somatic dysfunction of lumbar region: Secondary | ICD-10-CM | POA: Diagnosis not present

## 2019-02-03 DIAGNOSIS — M4036 Flatback syndrome, lumbar region: Secondary | ICD-10-CM | POA: Diagnosis not present

## 2019-03-15 DIAGNOSIS — M9903 Segmental and somatic dysfunction of lumbar region: Secondary | ICD-10-CM | POA: Diagnosis not present

## 2019-03-15 DIAGNOSIS — M4036 Flatback syndrome, lumbar region: Secondary | ICD-10-CM | POA: Diagnosis not present

## 2019-03-15 DIAGNOSIS — M9905 Segmental and somatic dysfunction of pelvic region: Secondary | ICD-10-CM | POA: Diagnosis not present

## 2019-03-15 DIAGNOSIS — Q72812 Congenital shortening of left lower limb: Secondary | ICD-10-CM | POA: Diagnosis not present

## 2019-04-04 DIAGNOSIS — Q72812 Congenital shortening of left lower limb: Secondary | ICD-10-CM | POA: Diagnosis not present

## 2019-04-04 DIAGNOSIS — M4036 Flatback syndrome, lumbar region: Secondary | ICD-10-CM | POA: Diagnosis not present

## 2019-04-04 DIAGNOSIS — M9903 Segmental and somatic dysfunction of lumbar region: Secondary | ICD-10-CM | POA: Diagnosis not present

## 2019-04-04 DIAGNOSIS — M9905 Segmental and somatic dysfunction of pelvic region: Secondary | ICD-10-CM | POA: Diagnosis not present

## 2019-04-05 DIAGNOSIS — H25813 Combined forms of age-related cataract, bilateral: Secondary | ICD-10-CM | POA: Diagnosis not present

## 2019-04-05 DIAGNOSIS — H524 Presbyopia: Secondary | ICD-10-CM | POA: Diagnosis not present

## 2019-04-05 DIAGNOSIS — H5203 Hypermetropia, bilateral: Secondary | ICD-10-CM | POA: Diagnosis not present

## 2019-04-05 DIAGNOSIS — H52203 Unspecified astigmatism, bilateral: Secondary | ICD-10-CM | POA: Diagnosis not present

## 2019-04-06 DIAGNOSIS — J300 Vasomotor rhinitis: Secondary | ICD-10-CM | POA: Diagnosis not present

## 2019-04-06 DIAGNOSIS — K219 Gastro-esophageal reflux disease without esophagitis: Secondary | ICD-10-CM | POA: Diagnosis not present

## 2019-05-10 DIAGNOSIS — M4036 Flatback syndrome, lumbar region: Secondary | ICD-10-CM | POA: Diagnosis not present

## 2019-05-10 DIAGNOSIS — M9905 Segmental and somatic dysfunction of pelvic region: Secondary | ICD-10-CM | POA: Diagnosis not present

## 2019-05-10 DIAGNOSIS — M9903 Segmental and somatic dysfunction of lumbar region: Secondary | ICD-10-CM | POA: Diagnosis not present

## 2019-05-10 DIAGNOSIS — Q72812 Congenital shortening of left lower limb: Secondary | ICD-10-CM | POA: Diagnosis not present

## 2019-05-31 DIAGNOSIS — K219 Gastro-esophageal reflux disease without esophagitis: Secondary | ICD-10-CM | POA: Diagnosis not present

## 2019-05-31 DIAGNOSIS — R05 Cough: Secondary | ICD-10-CM | POA: Diagnosis not present

## 2019-05-31 DIAGNOSIS — J312 Chronic pharyngitis: Secondary | ICD-10-CM | POA: Diagnosis not present

## 2019-06-06 DIAGNOSIS — Q72812 Congenital shortening of left lower limb: Secondary | ICD-10-CM | POA: Diagnosis not present

## 2019-06-06 DIAGNOSIS — M4036 Flatback syndrome, lumbar region: Secondary | ICD-10-CM | POA: Diagnosis not present

## 2019-06-06 DIAGNOSIS — M9905 Segmental and somatic dysfunction of pelvic region: Secondary | ICD-10-CM | POA: Diagnosis not present

## 2019-06-06 DIAGNOSIS — M9903 Segmental and somatic dysfunction of lumbar region: Secondary | ICD-10-CM | POA: Diagnosis not present

## 2019-07-18 DIAGNOSIS — R7301 Impaired fasting glucose: Secondary | ICD-10-CM | POA: Diagnosis not present

## 2019-07-18 DIAGNOSIS — M109 Gout, unspecified: Secondary | ICD-10-CM | POA: Diagnosis not present

## 2019-07-18 DIAGNOSIS — E7849 Other hyperlipidemia: Secondary | ICD-10-CM | POA: Diagnosis not present

## 2019-07-20 DIAGNOSIS — Z87891 Personal history of nicotine dependence: Secondary | ICD-10-CM | POA: Diagnosis not present

## 2019-07-20 DIAGNOSIS — Z9989 Dependence on other enabling machines and devices: Secondary | ICD-10-CM | POA: Diagnosis not present

## 2019-07-20 DIAGNOSIS — J310 Chronic rhinitis: Secondary | ICD-10-CM | POA: Diagnosis not present

## 2019-07-20 DIAGNOSIS — J342 Deviated nasal septum: Secondary | ICD-10-CM | POA: Diagnosis not present

## 2019-07-20 DIAGNOSIS — K219 Gastro-esophageal reflux disease without esophagitis: Secondary | ICD-10-CM | POA: Diagnosis not present

## 2019-07-21 DIAGNOSIS — I10 Essential (primary) hypertension: Secondary | ICD-10-CM | POA: Diagnosis not present

## 2019-07-21 DIAGNOSIS — R82998 Other abnormal findings in urine: Secondary | ICD-10-CM | POA: Diagnosis not present

## 2019-07-25 DIAGNOSIS — M109 Gout, unspecified: Secondary | ICD-10-CM | POA: Diagnosis not present

## 2019-07-25 DIAGNOSIS — I10 Essential (primary) hypertension: Secondary | ICD-10-CM | POA: Diagnosis not present

## 2019-07-25 DIAGNOSIS — G4733 Obstructive sleep apnea (adult) (pediatric): Secondary | ICD-10-CM | POA: Diagnosis not present

## 2019-07-25 DIAGNOSIS — I129 Hypertensive chronic kidney disease with stage 1 through stage 4 chronic kidney disease, or unspecified chronic kidney disease: Secondary | ICD-10-CM | POA: Diagnosis not present

## 2019-07-25 DIAGNOSIS — Z1339 Encounter for screening examination for other mental health and behavioral disorders: Secondary | ICD-10-CM | POA: Diagnosis not present

## 2019-07-25 DIAGNOSIS — R7301 Impaired fasting glucose: Secondary | ICD-10-CM | POA: Diagnosis not present

## 2019-07-25 DIAGNOSIS — N183 Chronic kidney disease, stage 3 (moderate): Secondary | ICD-10-CM | POA: Diagnosis not present

## 2019-07-25 DIAGNOSIS — Z1331 Encounter for screening for depression: Secondary | ICD-10-CM | POA: Diagnosis not present

## 2019-07-25 DIAGNOSIS — E785 Hyperlipidemia, unspecified: Secondary | ICD-10-CM | POA: Diagnosis not present

## 2019-07-25 DIAGNOSIS — R0982 Postnasal drip: Secondary | ICD-10-CM | POA: Diagnosis not present

## 2019-07-25 DIAGNOSIS — G629 Polyneuropathy, unspecified: Secondary | ICD-10-CM | POA: Diagnosis not present

## 2019-07-25 DIAGNOSIS — Z0000 Encounter for general adult medical examination without abnormal findings: Secondary | ICD-10-CM | POA: Diagnosis not present

## 2019-07-25 DIAGNOSIS — E669 Obesity, unspecified: Secondary | ICD-10-CM | POA: Diagnosis not present

## 2019-07-25 DIAGNOSIS — Z86010 Personal history of colonic polyps: Secondary | ICD-10-CM | POA: Diagnosis not present

## 2019-07-27 DIAGNOSIS — Z1212 Encounter for screening for malignant neoplasm of rectum: Secondary | ICD-10-CM | POA: Diagnosis not present

## 2019-07-27 DIAGNOSIS — Z85828 Personal history of other malignant neoplasm of skin: Secondary | ICD-10-CM | POA: Diagnosis not present

## 2019-07-27 DIAGNOSIS — L918 Other hypertrophic disorders of the skin: Secondary | ICD-10-CM | POA: Diagnosis not present

## 2019-07-27 DIAGNOSIS — L821 Other seborrheic keratosis: Secondary | ICD-10-CM | POA: Diagnosis not present

## 2019-07-27 DIAGNOSIS — L570 Actinic keratosis: Secondary | ICD-10-CM | POA: Diagnosis not present

## 2019-07-27 DIAGNOSIS — B351 Tinea unguium: Secondary | ICD-10-CM | POA: Diagnosis not present

## 2019-08-10 DIAGNOSIS — R809 Proteinuria, unspecified: Secondary | ICD-10-CM | POA: Diagnosis not present

## 2019-08-25 DIAGNOSIS — Z23 Encounter for immunization: Secondary | ICD-10-CM | POA: Diagnosis not present

## 2019-09-07 DIAGNOSIS — J342 Deviated nasal septum: Secondary | ICD-10-CM | POA: Diagnosis not present

## 2019-09-07 DIAGNOSIS — J310 Chronic rhinitis: Secondary | ICD-10-CM | POA: Diagnosis not present

## 2019-09-07 DIAGNOSIS — J343 Hypertrophy of nasal turbinates: Secondary | ICD-10-CM | POA: Diagnosis not present

## 2019-09-07 DIAGNOSIS — R070 Pain in throat: Secondary | ICD-10-CM | POA: Diagnosis not present

## 2019-09-07 DIAGNOSIS — R05 Cough: Secondary | ICD-10-CM | POA: Diagnosis not present

## 2019-09-08 DIAGNOSIS — R6889 Other general symptoms and signs: Secondary | ICD-10-CM | POA: Diagnosis not present

## 2019-09-08 DIAGNOSIS — R1011 Right upper quadrant pain: Secondary | ICD-10-CM | POA: Diagnosis not present

## 2019-09-09 DIAGNOSIS — R1011 Right upper quadrant pain: Secondary | ICD-10-CM | POA: Diagnosis not present

## 2019-09-10 ENCOUNTER — Emergency Department (HOSPITAL_COMMUNITY): Payer: Medicare Other

## 2019-09-10 ENCOUNTER — Emergency Department (HOSPITAL_COMMUNITY)
Admission: EM | Admit: 2019-09-10 | Discharge: 2019-09-10 | Disposition: A | Discharge status: Home / Self Care | Payer: Medicare Other | Attending: Emergency Medicine | Admitting: Emergency Medicine

## 2019-09-10 ENCOUNTER — Other Ambulatory Visit: Payer: Self-pay

## 2019-09-10 ENCOUNTER — Encounter (HOSPITAL_COMMUNITY): Payer: Self-pay | Admitting: Emergency Medicine

## 2019-09-10 DIAGNOSIS — R0602 Shortness of breath: Secondary | ICD-10-CM | POA: Diagnosis not present

## 2019-09-10 DIAGNOSIS — Z87891 Personal history of nicotine dependence: Secondary | ICD-10-CM | POA: Diagnosis not present

## 2019-09-10 DIAGNOSIS — J189 Pneumonia, unspecified organism: Secondary | ICD-10-CM | POA: Insufficient documentation

## 2019-09-10 DIAGNOSIS — Z20828 Contact with and (suspected) exposure to other viral communicable diseases: Secondary | ICD-10-CM | POA: Diagnosis not present

## 2019-09-10 DIAGNOSIS — Z79899 Other long term (current) drug therapy: Secondary | ICD-10-CM | POA: Diagnosis not present

## 2019-09-10 DIAGNOSIS — R000 Tachycardia, unspecified: Secondary | ICD-10-CM | POA: Diagnosis not present

## 2019-09-10 DIAGNOSIS — N200 Calculus of kidney: Secondary | ICD-10-CM | POA: Diagnosis not present

## 2019-09-10 DIAGNOSIS — I10 Essential (primary) hypertension: Secondary | ICD-10-CM | POA: Insufficient documentation

## 2019-09-10 DIAGNOSIS — R1013 Epigastric pain: Secondary | ICD-10-CM | POA: Diagnosis not present

## 2019-09-10 DIAGNOSIS — R079 Chest pain, unspecified: Secondary | ICD-10-CM | POA: Diagnosis not present

## 2019-09-10 DIAGNOSIS — R109 Unspecified abdominal pain: Secondary | ICD-10-CM | POA: Diagnosis present

## 2019-09-10 DIAGNOSIS — K5730 Diverticulosis of large intestine without perforation or abscess without bleeding: Secondary | ICD-10-CM | POA: Diagnosis not present

## 2019-09-10 LAB — COMPREHENSIVE METABOLIC PANEL
ALT: 27 U/L (ref 0–44)
AST: 24 U/L (ref 15–41)
Albumin: 3.9 g/dL (ref 3.5–5.0)
Alkaline Phosphatase: 48 U/L (ref 38–126)
Anion gap: 13 (ref 5–15)
BUN: 21 mg/dL (ref 8–23)
CO2: 23 mmol/L (ref 22–32)
Calcium: 9.1 mg/dL (ref 8.9–10.3)
Chloride: 102 mmol/L (ref 98–111)
Creatinine, Ser: 1.46 mg/dL — ABNORMAL HIGH (ref 0.61–1.24)
GFR calc Af Amer: 55 mL/min — ABNORMAL LOW (ref >60)
GFR calc non Af Amer: 47 mL/min — ABNORMAL LOW (ref >60)
Glucose, Bld: 91 mg/dL (ref 70–99)
Potassium: 4.4 mmol/L (ref 3.5–5.1)
Sodium: 138 mmol/L (ref 135–145)
Total Bilirubin: 0.9 mg/dL (ref 0.3–1.2)
Total Protein: 7.1 g/dL (ref 6.5–8.1)

## 2019-09-10 LAB — URINALYSIS, ROUTINE W REFLEX MICROSCOPIC
Bacteria, UA: NONE SEEN
Bilirubin Urine: NEGATIVE
Glucose, UA: NEGATIVE mg/dL
Hgb urine dipstick: NEGATIVE
Ketones, ur: NEGATIVE mg/dL
Leukocytes,Ua: NEGATIVE
Nitrite: NEGATIVE
Protein, ur: 100 mg/dL — AB
Specific Gravity, Urine: 1.015 (ref 1.005–1.030)
pH: 5 (ref 5.0–8.0)

## 2019-09-10 LAB — CBC
HCT: 44.9 % (ref 39.0–52.0)
Hemoglobin: 14.3 g/dL (ref 13.0–17.0)
MCH: 30.4 pg (ref 26.0–34.0)
MCHC: 31.8 g/dL (ref 30.0–36.0)
MCV: 95.3 fL (ref 80.0–100.0)
Platelets: 240 10*3/uL (ref 150–400)
RBC: 4.71 MIL/uL (ref 4.22–5.81)
RDW: 14 % (ref 11.5–15.5)
WBC: 9 10*3/uL (ref 4.0–10.5)
nRBC: 0 % (ref 0.0–0.2)

## 2019-09-10 LAB — TROPONIN I (HIGH SENSITIVITY)
Troponin I (High Sensitivity): 8 ng/L (ref <18)
Troponin I (High Sensitivity): 5 ng/L (ref <18)

## 2019-09-10 LAB — LIPASE, BLOOD: Lipase: 33 U/L (ref 11–51)

## 2019-09-10 MED ORDER — PANTOPRAZOLE SODIUM 20 MG PO TBEC: 20.0000 mg | DELAYED_RELEASE_TABLET | Freq: Every day | ORAL | 0 refills | Status: DC

## 2019-09-10 MED ORDER — SODIUM CHLORIDE 0.9% FLUSH: 3.0000 mL | Freq: Once | INTRAVENOUS | Status: DC

## 2019-09-10 MED ORDER — ONDANSETRON HCL 4 MG/2ML IJ SOLN
4.0000 mg | Freq: Once | INTRAMUSCULAR | Status: DC
  Filled 2019-09-10: qty 2

## 2019-09-10 MED ORDER — IOHEXOL 300 MG/ML  SOLN
100.0000 mL | Freq: Once | INTRAMUSCULAR | Status: AC | PRN
  Administered 2019-09-10: 20:00:00 100 mL via INTRAVENOUS

## 2019-09-10 MED ORDER — DOXYCYCLINE HYCLATE 100 MG PO CAPS: 100.0000 mg | ORAL_CAPSULE | Freq: Two times a day (BID) | ORAL | 0 refills | Status: DC

## 2019-09-10 MED ORDER — DOXYCYCLINE HYCLATE 100 MG PO TABS
100.0000 mg | ORAL_TABLET | Freq: Once | ORAL | Status: AC
  Administered 2019-09-10: 100 mg via ORAL
  Filled 2019-09-10: qty 1

## 2019-09-10 MED ORDER — MORPHINE SULFATE (PF) 4 MG/ML IV SOLN
4.0000 mg | Freq: Once | INTRAVENOUS | Status: DC
  Filled 2019-09-10: qty 1

## 2019-09-10 MED ORDER — SODIUM CHLORIDE 0.9 % IV BOLUS
1000.0000 mL | Freq: Once | INTRAVENOUS | Status: AC
  Administered 2019-09-10: 20:00:00 1000 mL via INTRAVENOUS

## 2019-09-10 NOTE — ED Provider Notes (Signed)
[facility] EMERGENCY DEPARTMENT Provider Note   CSN: [id number] Arrival date & time: 09/10/19  1717     History   Chief Complaint Chief Complaint  Patient presents with   Abdominal Pain   Chest Pain   Shortness of Breath    HPI Danny Kennedy is a 73 y.o. male.     Pt presents to the ED today with upper abdominal pain and left sided chest pain.  Pt said he's been having abdominal pain for a few days.  He did see his pcp who ordered an outpatient GB US.  The pt said he was told it was nl.  The pt continues with pain, so he came in.  The pt denies d/c.  No cough.  No sob.  No known recent covid exposures.     Past Medical History:  Diagnosis Date   Alopecia    Diverticulosis    Dupuytren contracture    RUE @ thumb   Gout    Hyperlipemia    LDL goal = < 100   Nephrolithiasis [id number]   Dr Serita Butcher, X 2    OSA on CPAP    Sleep apnea    CPAP @ 11cm   Unspecified essential hypertension 05/17/2009    Patient Active Problem List   Diagnosis Date Noted   Intraabdominal abscess w h/o perforated gangrenous appendicitis 05/14/2018   Acute gangrenous appendicitis s/p lap appendectomy 05/05/2018 05/05/2018   Obesity (BMI 30-39.9) 09/16/2017   Other seasonal allergic rhinitis 08/15/2015   OSA (obstructive sleep apnea) 07/25/2014   UARS (upper airway resistance syndrome) 07/25/2014   Lumbar radiculopathy 10/26/2013   Non-allergic rhinitis 10/26/2013   Squamous cell cancer of external ear 04/15/2013   Hx of adenomatous colonic polyps 09/18/2011   NEPHROLITHIASIS, HX OF 10/01/2010   Essential hypertension 05/17/2009   DIVERTICULOSIS, COLON 05/17/2009   FASTING HYPERGLYCEMIA 05/17/2009   GOUT 08/22/2008   HYPERLIPIDEMIA 04/05/2008   DUPUYTREN'S CONTRACTURE 04/08/2007    Past Surgical History:  Procedure Laterality Date   APPENDECTOMY     COLONOSCOPY W/ POLYPECTOMY  2005, 2007   x2 negative 10-2007, Dr Sharlett Iles     IR RADIOLOGIST EVAL & MGMT  06/01/2018   LAPAROSCOPIC APPENDECTOMY N/A 05/05/2018   Procedure: APPENDECTOMY LAPAROSCOPIC;  Surgeon: Excell Seltzer, MD;  Location: [facility] ORS;  Service: General;  Laterality: N/A;   renal calculus     retirieved via basket 1984; stone passed spontaneously 2011; Otic tube R TM   [place] Medications    Prior to Admission medications   Medication Sig Start Date End Date Taking? Authorizing Provider  aspirin-acetaminophen-caffeine (EXCEDRIN MIGRAINE) [phone] MG tablet Take 2 tablets by mouth every 6 (six) hours as needed for headache.   Yes [provider]  BENAZEPRIL HCL PO Take by mouth.   Yes [provider]  colchicine 0.6 MG tablet Take 0.6 mg by mouth 3 (three) times daily as needed (gout flare up.).   Yes [provider]  famotidine (PEPCID) 20 MG tablet Take 20 mg by mouth 2 (two) times daily. 04/13/19  Yes [provider]  FIBER COMPLETE TABS Take 1 tablet by mouth daily.    Yes [provider]  finasteride (PROSCAR) 5 MG tablet take 1 tablet by mouth once daily as directed Patient taking differently: Take 2.5 mg tablet by mouth daily.   Yes Hendricks Limes, MD  gabapentin (NEURONTIN) 300 MG capsule Take 300 mg  by mouth at bedtime. 09/07/19  Yes [provider]  Glucosamine-Chondroit-Vit C-Mn (GLUCOSAMINE 1500 COMPLEX PO) Take 2 tablets by mouth daily.   Yes [provider]  ipratropium (ATROVENT) 0.03 % nasal spray Place 2 sprays into the nose 3 (three) times daily as needed for allergies. 04/06/19  Yes [provider]  multivitamin (THERAGRAN) per tablet Take 1 tablet by mouth daily.     Yes [provider]  olmesartan (BENICAR) 20 MG tablet  08/10/18  Yes [provider]  ciprofloxacin (CIPRO) 500 MG tablet Take 1 tablet (500 mg total) by mouth 2 (two) times daily. Patient not taking: Reported on 09/10/2019 05/15/18   Michael Boston,  MD  doxycycline (VIBRAMYCIN) 100 MG capsule Take 1 capsule (100 mg total) by mouth 2 (two) times daily. 09/10/19   Isla Pence, MD  metroNIDAZOLE (FLAGYL) 500 MG tablet Take 1 tablet (500 mg total) by mouth 2 (two) times daily. Patient not taking: Reported on 09/10/2019 05/15/18   Michael Boston, MD  pantoprazole (PROTONIX) 20 MG tablet Take 1 tablet (20 mg total) by mouth daily. 09/10/19   Isla Pence, MD  pravastatin (PRAVACHOL) 40 MG tablet take 1 tablet by mouth at bedtime Patient not taking: Reported on 09/10/2019 03/21/14   Hendricks Limes, MD    Family History Family History  Problem Relation Age of Onset   Cancer Father        LUNG ,THROAT   Esophageal cancer Father    Hypertension Maternal Grandmother    Stroke Maternal Grandmother 72   Diabetes Maternal Grandmother    Heart attack Paternal Uncle        X2, neither pre 81   Heart attack Maternal Uncle        not pre 34   Lung cancer Maternal Grandfather     Social History Social History   Tobacco Use   Smoking status: Former Smoker    Quit date: 11/03/1990    Years since quitting: 28.8   Smokeless tobacco: Former Systems developer    Types: Chew   Tobacco comment: Quit 2005  Substance Use Topics   Alcohol use: Yes    Comment: 2 cans beer a month   Drug use: No     Allergies   Patient has no known allergies.   Review of Systems Review of Systems  Gastrointestinal: Positive for abdominal pain.  All other systems reviewed and are negative.    Physical Exam Updated Vital Signs BP (!) 132/94    Pulse 96    Temp 98.8 F (37.1 C) (Oral)    Resp (!) 26    SpO2 99%   Physical Exam Vitals signs and nursing note reviewed.  Constitutional:      Appearance: He is well-developed.  HENT:     Head: Normocephalic and atraumatic.     Mouth/Throat:     Mouth: Mucous membranes are moist.     Pharynx: Oropharynx is clear.  Eyes:     Extraocular Movements: Extraocular movements intact.     Pupils: Pupils are  equal, round, and reactive to light.  Cardiovascular:     Rate and Rhythm: Normal rate and regular rhythm.     Heart sounds: Normal heart sounds.  Pulmonary:     Effort: Pulmonary effort is normal.     Breath sounds: Normal breath sounds.  Abdominal:     General: Abdomen is flat and scaphoid. Bowel sounds are normal.     Palpations: Abdomen is soft.     Tenderness:  There is abdominal tenderness in the epigastric area.  Skin:    General: Skin is warm.     Capillary Refill: Capillary refill takes less than 2 seconds.  Neurological:     General: No focal deficit present.     Mental Status: He is alert and oriented to person, place, and time.  Psychiatric:        Mood and Affect: Mood normal.        Behavior: Behavior normal.      ED Treatments / Results  Labs (all labs ordered are listed, but only abnormal results are displayed) Labs Reviewed  COMPREHENSIVE METABOLIC PANEL - Abnormal; Notable for the following components:      Result Value   Creatinine, Ser 1.46 (*)    GFR calc non Af Amer 47 (*)    GFR calc Af Amer 55 (*)    All other components within normal limits  URINALYSIS, ROUTINE W REFLEX MICROSCOPIC - Abnormal; Notable for the following components:   Protein, ur 100 (*)    All other components within normal limits  SARS CORONAVIRUS 2 (TAT 6-24 HRS)  LIPASE, BLOOD  CBC  TROPONIN I (HIGH SENSITIVITY)  TROPONIN I (HIGH SENSITIVITY)    EKG EKG Interpretation  Date/Time:  Saturday September 10 2019 17:32:42 EST Ventricular Rate:  101 PR Interval:  148 QRS Duration: 76 QT Interval:  336 QTC Calculation: 435 R Axis:   -41 Text Interpretation: Sinus tachycardia Left axis deviation Abnormal ECG No old tracing to compare Confirmed by Isla Pence [phone]) on 09/10/2019 6:20:34 PM   Radiology Dg Chest 2 View  Result Date: 09/10/2019 CLINICAL DATA:  Shortness of breath, epigastric pain EXAM: CHEST - 2 VIEW COMPARISON:  CT abdomen pelvis 06/01/2018 FINDINGS: Low  lung volumes with streaky basilar areas of atelectasis in the lung bases. No pneumothorax or effusion. The cardiomediastinal contours are unremarkable. No acute osseous abnormality or suspicious osseous lesion. Carotid vascular calcifications. No acute osseous or soft tissue abnormality. IMPRESSION: Low lung volumes with streaky opacities favoring atelectasis. Electronically Signed   By: Lovena Le M.D.   On: 09/10/2019 19:38   Ct Abdomen Pelvis W Contrast  Result Date: 09/10/2019 CLINICAL DATA:  Acute abdominal pain. Epigastric pain. EXAM: CT ABDOMEN AND PELVIS WITH CONTRAST TECHNIQUE: Multidetector CT imaging of the abdomen and pelvis was performed using the standard protocol following bolus administration of intravenous contrast. CONTRAST:  186mL OMNIPAQUE IOHEXOL 300 MG/ML  SOLN COMPARISON:  May 02, 2018 FINDINGS: Lower chest: There are linear airspace opacities at the lung bases bilaterally favored to represent atelectasis.The heart size is normal. Hepatobiliary: The liver is normal. Normal gallbladder.There is no biliary ductal dilation. Pancreas: Normal contours without ductal dilatation. No peripancreatic fluid collection. Spleen: No splenic laceration or hematoma. Adrenals/Urinary Tract: --Adrenal glands: No adrenal hemorrhage. --Right kidney/ureter: There is a punctate nonobstructing stone in the upper pole the right kidney. --Left kidney/ureter: No hydronephrosis or perinephric hematoma. --Urinary bladder: Unremarkable. Stomach/Bowel: --Stomach/Duodenum: No hiatal hernia or other gastric abnormality. Normal duodenal course and caliber. --Small bowel: No dilatation or inflammation. --Colon: Rectosigmoid diverticulosis without acute inflammation. --Appendix: Surgically absent. Vascular/Lymphatic: Atherosclerotic calcification is present within the non-aneurysmal abdominal aorta, without hemodynamically significant stenosis. --No retroperitoneal lymphadenopathy. --No mesenteric lymphadenopathy. --No  pelvic or inguinal lymphadenopathy. Reproductive: Unremarkable Other: No ascites or free air. There is a small right-sided fat containing inguinal hernia. Musculoskeletal. No acute displaced fractures. IMPRESSION: 1. No acute abdominopelvic abnormality. 2. Rectosigmoid diverticulosis without acute inflammation. 3. Aortic atherosclerosis. 4. Nonobstructive right  nephrolithiasis. 5. Linear airspace opacities at the lung bases bilaterally favored to represent atelectasis. An infiltrate is not entirely excluded. Aortic Atherosclerosis (ICD10-I70.0). Electronically Signed   By: Constance Holster M.D.   On: 09/10/2019 20:47    Procedures Procedures (including critical care time)  Medications Ordered in ED Medications  sodium chloride flush (NS) 0.9 % injection 3 mL (has no administration in time range)  sodium chloride flush (NS) 0.9 % injection 3 mL (has no administration in time range)  morphine 4 MG/ML injection 4 mg (0 mg Intravenous Hold 09/10/19 1947)  ondansetron (ZOFRAN) injection 4 mg (0 mg Intravenous Hold 09/10/19 1947)  sodium chloride 0.9 % bolus 1,000 mL (0 mLs Intravenous Stopped 09/10/19 2058)  iohexol (OMNIPAQUE) 300 MG/ML solution 100 mL (100 mLs Intravenous Contrast Given 09/10/19 2013)  doxycycline (VIBRA-TABS) tablet 100 mg (100 mg Oral Given 09/10/19 2057)     Initial Impression / Assessment and Plan / ED Course  I have reviewed the triage vital signs and the nursing notes.  Pertinent labs & imaging results that were available during my care of the patient were reviewed by me and considered in my medical decision making (see chart for details).       CT shows nothing acute in the abdomen, but it does show some opacities.  Atelectasis vs infiltrate.  As pt has been sob, he could have pna.  No fevers and wbc ok.  Covid swab pending.  Pt will be treated with doxy.    Epigastric pain could be from the ? pna or gastritis.  Pt is already on pepcid, so I will start him on  protonix.  Pt is stable for d/c.  Return if worse.  Travien Murzyn was evaluated in Emergency Department on 09/10/2019 for the symptoms described in the history of present illness. He was evaluated in the context of the global COVID-19 pandemic, which necessitated consideration that the patient might be at risk for infection with the SARS-CoV-2 virus that causes COVID-19. Institutional protocols and algorithms that pertain to the evaluation of patients at risk for COVID-19 are in a state of rapid change based on information released by regulatory bodies including the CDC and federal and state organizations. These policies and algorithms were followed during the patient's care in the ED.  Final Clinical Impressions(s) / ED Diagnoses   Final diagnoses:  Epigastric pain  Community acquired pneumonia, unspecified laterality    ED Discharge Orders         Ordered    pantoprazole (PROTONIX) 20 MG tablet  Daily     09/10/19 2100    doxycycline (VIBRAMYCIN) 100 MG capsule  2 times daily     09/10/19 2100           Isla Pence, MD 09/10/19 2101

## 2019-09-10 NOTE — ED Triage Notes (Signed)
C/o epigastric pain that started Thursday and radiated across upper abd.  States pain is now below L shoulder blade and in L chest with SOB.

## 2019-09-10 NOTE — ED Notes (Signed)
Patient verbalizes understanding of discharge instructions. Opportunity for questioning and answers were provided. Armband removed by staff, pt discharged from ED. Pt. ambulatory and discharged home.  

## 2019-09-11 LAB — SARS CORONAVIRUS 2 (TAT 6-24 HRS): SARS Coronavirus 2: NEGATIVE

## 2019-09-14 DIAGNOSIS — I10 Essential (primary) hypertension: Secondary | ICD-10-CM | POA: Diagnosis not present

## 2019-09-14 DIAGNOSIS — K2970 Gastritis, unspecified, without bleeding: Secondary | ICD-10-CM | POA: Diagnosis not present

## 2019-09-14 DIAGNOSIS — J189 Pneumonia, unspecified organism: Secondary | ICD-10-CM | POA: Diagnosis not present

## 2019-10-05 ENCOUNTER — Ambulatory Visit (INDEPENDENT_AMBULATORY_CARE_PROVIDER_SITE_OTHER): Payer: Medicare Other | Admitting: Neurology

## 2019-10-05 ENCOUNTER — Other Ambulatory Visit: Payer: Self-pay

## 2019-10-05 ENCOUNTER — Encounter: Payer: Self-pay | Admitting: Neurology

## 2019-10-05 VITALS — BP 122/70 | HR 98 | Temp 97.2°F | Ht 71.0 in | Wt 232.5 lb

## 2019-10-05 DIAGNOSIS — J310 Chronic rhinitis: Secondary | ICD-10-CM

## 2019-10-05 DIAGNOSIS — G4733 Obstructive sleep apnea (adult) (pediatric): Secondary | ICD-10-CM

## 2019-10-05 DIAGNOSIS — G478 Other sleep disorders: Secondary | ICD-10-CM | POA: Diagnosis not present

## 2019-10-05 DIAGNOSIS — I10 Essential (primary) hypertension: Secondary | ICD-10-CM | POA: Diagnosis not present

## 2019-10-05 DIAGNOSIS — J189 Pneumonia, unspecified organism: Secondary | ICD-10-CM | POA: Diagnosis not present

## 2019-10-05 DIAGNOSIS — E669 Obesity, unspecified: Secondary | ICD-10-CM

## 2019-10-05 NOTE — Progress Notes (Signed)
[facility]   Provider:  Larey Seat, MD   Referring Provider: Marton Redwood, MD Primary Care Physician:  Marton Redwood, MD  Chief Complaint  Patient presents with  . Follow-up    RM 10, alone. Last seen 09/21/2018. Here to f/u on OSA.     HPI:  Danny Kennedy is a 73 y.o. male patient is seen here for a yearly revisit with compliance of CPAP in the treatment of sleep apnea.    10-05-2019, Danny Kennedy is a 73 y.o. male patient is seen here for a yearly revisit with compliance of CPAP in the treatment of sleep apnea. 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  He had to shut down his company for 10 days as so many employees had Covid 19.  He developed pneumonia , not COVID related and was his home treated for 5 days.  He has now been on this CPAP for 20 plus years. His current machine is 5 .    Danny Kennedy has been a compliant CPAP user from the start and this year his compliance report is no different, he averaged 97% compliance with an average of 6 hours 45 minutes he is using an AutoSet that is now 73 years old with a minimum pressure of 6 and a maximum pressure of 11 cmH2O and 1 cm EPR.   The AHI is 0.5 which speaks for an excellent resolution and the 95th percentile pressure of 10.9 cmH2O he has minimal air leakage.  There are no central apneas arising.  I encouraged him to keep his machine but he is entitled to a new one and I will again write for an auto titration set with the same settings as currently used since he tolerates those very well. How likely are you to doze in the following situations: 0 = not likely, 1 = slight chance, 2 = moderate chance, 3 = high chance  Sitting and Reading? Watching Television? Sitting inactive in a public place (theater or meeting)? Lying down in the afternoon when circumstances permit? Sitting and talking to someone? Sitting quietly after lunch without  alcohol? In a car, while stopped for a few minutes in traffic? As a passenger in a car for an hour without a break?  Total =5/ 24    09-21-2018 high compliance on CPAP, god resolution. He lost 16 pounds with an appendectomy July 2019.   He never had the classic symptoms of an appendicitis, he developed dry heaves and only after repeated palpation of the abdomen was a trigger point found, it was not a classical McBurney spot.  He never have the typical pain, but he developed low-grade continued fevers after appendectomy and had developed an abscess.  As to his CPAP compliance was 83% for the last 30 days with an average daily usage time of 5 hours 48 minutes, AutoSet between 6 and 11 cmH2O is used with 1 cm EPR, AHI was 0.5/h 95th percentile pressure is 10.9 cmH2O he has minimal air leakage, he appears neither depressed nor excessively daytime sleepy Epworth sleepiness score was endorsed at 7 points and the fatigue severity scale at 16 points.  He has his second (and older CPAP )  machine at his [place]. He needs a new ESON headgear and mask in [place]. His older machine is set on CM 11.   Interval history from 16 September 2017. This is a regular routine yearly follow-up  for CPAP compliance with Danny Kennedy.  He has been a compliant CPAP user, alternating between CPAP machines and his second home in his primary home.  Together these reports would shortness of a 97% compliance for hours and 100% 4 days.  Average use at time is 7 hours and 5 minutes.  Residual AHI is 0.4 which is an excellent result.  95th percentile pressure is 10.9 cm water, there are no treatment emergent central apneas noted.  There are no changes necessary.  The patient received supplies for his CPAP through [facility] home [facility]. The last prescribed machine is 73 years old, the older one is 73 years old, and he is using CPAP for over 20 years.He reports that one night when he forgot to bring his CPAP with him he felt his airway  collapsing and actually caused him to panic. He is a very compliant CPAP user.    Consultation note: CD .  Danny Kennedy states that he was diagnosed with obstructive sleep apnea is so many years ago he doesn't really know when exactly the diagnosis was made. Dr. Claiborne Rigg ENT- physician had sent him to [facility] hospital about 18 years ago for a sleep study, he was given the CPAP after this evaluation. Dr. Linna Darner  PCP followed him at the time.  He had [employer] as his DME, but he couldn't get any new masks after not having seen a physician in so many years.  He never had physician follow up in regards to CPAP.  The patient has been using an 11 cm water pressure setting as long as he can remember. He feels that the CPAP is working fine for him , but needs his new equipment. He cannot sleep without his machine. He snores loudly without his machine.   Bedtime is 11 Pm and he will fall asleep promptly ( ' in about 2 minutes '), he wakes once a night for a bathroom break and sleeps through until 7 AM, spontaneously, no alarm is needed.  During the day , he drinks diet cokes, no coffee.  ETOH intake is 2 beers a week, and no tobacco use.  He doesn't nap. He does have firearms in the house- [place], those a locked up.   08-15-15 compliance download;  the patient has used a CPAP 73% with over 4 hours of consecutive nightly use. The average use is 4 hours and 54 minutes. He regularly does not use this CPAP on Sunday nights, but she spends at his [place]. He has another CPAP machine at that location his overall compliance from both machines is well above 90%. His current machine is set between 6 and 11 cm pressure window was 1 cm expiratory rash or relief or EPR. [place] at 11 cm water pressure the 95th percentile pressure for this I will total said machine is 10.9. He is doing well with that and has a residual AHI of only 0.4 - no adjustments have to be made. The patient will receive new  supplies,  he actually already received some last week from [facility].   He had no falls in the last year, he has not had an elevation of depression he keeps an active lifestyle. He likes to deer hunt. Non smoker, Non drinker.  His wife has had recently some health scare and is currently in rehabilitation working on her ambulation and endurance.  Interval history from 09/16/2016, I had the pleasure of seeing Danny Kennedy today  whom I have followed for over 3 years now regularly on CPAP and to has an established diagnosis of obstructive sleep apnea for well over 20 years. He is currently using an AutoSet between 6 and 11 cm water was 1 cm EPR, residual AHI is 0.3. He alternates between 2 different CPAP machines averaging usually 7 hours and 3 minutes user time each night. I was able to review the compliance record of his normal machines which would give him a compliance of 70% however he is using another machine on weekends at the second home. He endorsed no significant daytime sleepiness, his fatigue severity scale  score was 19 points,  Epworth sleepiness score 3 points and to so-called GE reactive depression score was 1/15 points. His wife had 4 major surgeries in 4 years and has struggled with health. The couple likes to travel.   Review of Systems:  Out of a complete 14 system review, the patient complains of only the following symptoms, and all other reviewed systems are negative.  He endorsed no significant daytime sleepiness, his fatigue severity scale score was endorsed at 6/ 63  points, Epworth sleepiness score 7 points and to  points.  No nocturia, no snoring on CPAP- needs new headgear ( ESON Medium )  , tube and clip.    Social History   Socioeconomic History  . Marital status: Married    Spouse name: [name]  . Number of children: 2  . Years of education: College  . Highest education level: Not on file  Occupational History  . Occupation: Curator: other   Social Needs  . Financial resource strain: Not on file  . Food insecurity    Worry: Not on file    Inability: Not on file  . Transportation needs    Medical: Not on file    Non-medical: Not on file  Tobacco Use  . Smoking status: Former Smoker    Quit date: 11/03/1990    Years since quitting: 28.9  . Smokeless tobacco: Former Systems developer    Types: Chew  . Tobacco comment: Quit 2005  Substance and Sexual Activity  . Alcohol use: Yes    Comment: 2 cans beer a month  . Drug use: No  . Sexual activity: Not on file  Lifestyle  . Physical activity    Days per week: Not on file    Minutes per session: Not on file  . Stress: Not on file  Relationships  . Social Herbalist on phone: Not on file    Gets together: Not on file    Attends religious service: Not on file    Active member of club or organization: Not on file    Attends meetings of clubs or organizations: Not on file    Relationship status: Not on file  . Intimate partner violence    Fear of current or ex partner: Not on file    Emotionally abused: Not on file    Physically abused: Not on file    Forced sexual activity: Not on file  Other Topics Concern  . Not on file  Social History Narrative   Patient is married [name]) and lives at home with his wife.   Patient owns [employer].   Patient has a college education.   Patient has two sons and four grandchildren.   Patient is right-handed.   Patient drinks coffee, Diet cokes about four + a week.  Family History  Problem Relation Age of Onset  . Cancer Father        LUNG ,THROAT  . Esophageal cancer Father   . Hypertension Maternal Grandmother   . Stroke Maternal Grandmother 72  . Diabetes Maternal Grandmother   . Heart attack Paternal Uncle        X2, neither pre 55  . Heart attack Maternal Uncle        not pre 55  . Lung cancer Maternal Grandfather     Past Medical History:  Diagnosis Date  . Alopecia   .  Diverticulosis   . Dupuytren contracture    RUE @ thumb  . Gout   . Hyperlipemia    LDL goal = < 100  . Nephrolithiasis [id number]   Dr Serita Butcher, X 2   . OSA on CPAP   . Sleep apnea    CPAP @ 11cm  . Unspecified essential hypertension 05/17/2009    Past Surgical History:  Procedure Laterality Date  . APPENDECTOMY    . COLONOSCOPY W/ POLYPECTOMY  2005, 2007   x2 negative 10-2007, Dr Sharlett Iles   . IR RADIOLOGIST EVAL & MGMT  06/01/2018  . LAPAROSCOPIC APPENDECTOMY N/A 05/05/2018   Procedure: APPENDECTOMY LAPAROSCOPIC;  Surgeon: Excell Seltzer, MD;  Location: [facility];  Service: General;  Laterality: N/A;  . renal calculus     retirieved via basket 1984; stone passed spontaneously 2011; Otic tube R TM  . WISDOM TOOTH EXTRACTION      Current Outpatient Medications  Medication Sig Dispense Refill  . aspirin-acetaminophen-caffeine (EXCEDRIN MIGRAINE) 250-250-65 MG tablet Take 2 tablets by mouth every 6 (six) hours as needed for headache.    . colchicine 0.6 MG tablet Take 0.6 mg by mouth 3 (three) times daily as needed (gout flare up.).    Marland Kitchen famotidine (PEPCID) 20 MG tablet Take 20 mg by mouth 2 (two) times daily.    Marland Kitchen FIBER COMPLETE TABS Take 1 tablet by mouth daily.     . finasteride (PROSCAR) 5 MG tablet take 1 tablet by mouth once daily as directed (Patient taking differently: Take 2.5 mg by mouth daily. ) 30 tablet 1  . gabapentin (NEURONTIN) 300 MG capsule Take 300 mg by mouth at bedtime.    . Glucosamine-Chondroit-Vit C-Mn (GLUCOSAMINE 1500 COMPLEX PO) Take 2 tablets by mouth daily.    Marland Kitchen ipratropium (ATROVENT) 0.03 % nasal spray Place 2 sprays into the nose 3 (three) times daily as needed for allergies.    . multivitamin (THERAGRAN) per tablet Take 1 tablet by mouth daily.      Marland Kitchen olmesartan (BENICAR) 20 MG tablet Take 20 mg by mouth daily.     . pantoprazole (PROTONIX) 20 MG tablet Take 1 tablet (20 mg total) by mouth daily. 30 tablet 0  . pravastatin (PRAVACHOL) 40 MG tablet  take 1 tablet by mouth at bedtime 90 tablet 1   No current facility-administered medications for this visit.     Allergies as of 10/05/2019  . (No Known Allergies)    Vitals: BP 122/70 (BP Location: Left Arm, Patient Position: Sitting, Cuff Size: Normal)   Pulse 98   Temp (!) 97.2 F (36.2 C)   Ht 5\' 11"  (1.803 m)   Wt 232 lb 8 oz (105.5 kg)   SpO2 96%   BMI 32.43 kg/m  Last Weight:  Wt Readings from Last 1 Encounters:  10/05/19 232 lb 8 oz (105.5 kg)   Last Height:   Ht Readings from Last 1  Encounters:  10/05/19 5\' 11"  (1.803 m)    Physical exam:  General: The patient is awake, alert and appears not in acute distress. The patient is well groomed. Head: Normocephalic, atraumatic. Neck is supple. Mallampati 3, very low and narrow airway, neck circumference: 19 inches. Nasal congestions.  Cardiovascular: Regular rate and rhythm, without murmurs or carotid bruit, and without distended neck veins. Respiratory: Lungs are clear to auscultation. Skin:  no edema, nor rash Trunk: BMI is elevated ( 32) and patient has normal posture.  Neurologic exam : no loss of taste or smell. The patient is awake and alert, oriented to place and time. Memory subjective described as intact.  There is a normal attention span & concentration ability. Speech is fluent without dysarthria, dysphonia or aphasia. Mood and affect are appropriate. Cranial nerves:Pupils are equal and briskly reactive to light.  Visual fields by finger perimetry are intact. Hearing to finger rub intact.  Facial motor strength is symmetric and tongue and uvula move midline. Motor exam: Normal tone, muscle bulk and symmetric strength in all extremities at expected level.  Assessment:  After physical and neurologic examination, review of laboratory studies, imaging, neurophysiology testing and pre-existing records, assessment is   1) Patient with a 20 plus year history of apnea. On CPAP, highly compiant but alternating  between 2 machines, one at his [facility] .  Currently happy CPAP user on  11 cm water setting for his older machine ( at his second home) and  A newer CPAP auto-set as at home machine, 83% -with a residual AHI of 0. 4. The patient uses an AutoSet between 6 and 11 cm water was 1 cm EPR. He does have minor air leaks. He does have an older machine at the second home and therefore the compliance is 100% on merged records - from both CPAP's.   Plan:  Treatment plan and additional workup :  1)  Keep using CPAP !   New machine ordered, he will keep his older machine at his [place].  Machine number: [id number]. RV in 12 month. Keep up the good compliance.     Larey Seat, MD   Cc: Dr. Carmie Kanner .

## 2019-11-24 ENCOUNTER — Ambulatory Visit: Payer: Medicare Other | Attending: Internal Medicine

## 2019-11-24 DIAGNOSIS — Z23 Encounter for immunization: Secondary | ICD-10-CM | POA: Insufficient documentation

## 2019-11-24 NOTE — Progress Notes (Signed)
   Covid-19 Vaccination Clinic  Name:  Doel Heiting    MRN: [id number] DOB: September 09, 1946  11/24/2019  Mr. Alltop was observed post Covid-19 immunization for 15 minutes without incidence. He was provided with Vaccine Information Sheet and instruction to access the V-Safe system.   Mr. Lone was instructed to call 911 with any severe reactions post vaccine: Marland Kitchen Difficulty breathing  . Swelling of your face and throat  . A fast heartbeat  . A bad rash all over your body  . Dizziness and weakness    Immunizations Administered    Name Date Dose VIS Date Route   Pfizer COVID-19 Vaccine 11/24/2019  9:26 AM 0.3 mL 10/14/2019 Intramuscular   Manufacturer: [place]   Lot: [id number]   [place]: [id number]

## 2019-12-09 ENCOUNTER — Ambulatory Visit: Payer: Medicare Other

## 2019-12-15 ENCOUNTER — Ambulatory Visit: Payer: Medicare Other

## 2019-12-19 ENCOUNTER — Ambulatory Visit: Payer: Medicare Other | Attending: Internal Medicine

## 2019-12-19 DIAGNOSIS — J189 Pneumonia, unspecified organism: Secondary | ICD-10-CM | POA: Diagnosis not present

## 2019-12-19 DIAGNOSIS — Z23 Encounter for immunization: Secondary | ICD-10-CM | POA: Insufficient documentation

## 2019-12-19 NOTE — Progress Notes (Signed)
   Covid-19 Vaccination Clinic  Name:  Danny Kennedy    MRN: [id number] DOB: 10/18/1946  12/19/2019  Mr. Danny Kennedy was observed post Covid-19 immunization for 15 minutes without incidence. He was provided with Vaccine Information Sheet and instruction to access the V-Safe system.   Mr. Danny Kennedy was instructed to call 911 with any severe reactions post vaccine: Marland Kitchen Difficulty breathing  . Swelling of your face and throat  . A fast heartbeat  . A bad rash all over your body  . Dizziness and weakness    Immunizations Administered    Name Date Dose VIS Date Route   Pfizer COVID-19 Vaccine 12/19/2019 11:08 AM 0.3 mL 10/14/2019 Intramuscular   Manufacturer: [place]   Lot: [id number]   [place]: [id number]

## 2020-01-01 ENCOUNTER — Encounter: Payer: Self-pay | Admitting: Neurology

## 2020-01-03 ENCOUNTER — Encounter: Payer: Self-pay | Admitting: Neurology

## 2020-01-03 ENCOUNTER — Other Ambulatory Visit: Payer: Self-pay

## 2020-01-03 ENCOUNTER — Ambulatory Visit (INDEPENDENT_AMBULATORY_CARE_PROVIDER_SITE_OTHER): Payer: Medicare Other | Admitting: Neurology

## 2020-01-03 VITALS — BP 142/83 | HR 88 | Temp 97.0°F | Ht 71.0 in | Wt 233.0 lb

## 2020-01-03 DIAGNOSIS — J189 Pneumonia, unspecified organism: Secondary | ICD-10-CM | POA: Diagnosis not present

## 2020-01-03 DIAGNOSIS — G4733 Obstructive sleep apnea (adult) (pediatric): Secondary | ICD-10-CM | POA: Diagnosis not present

## 2020-01-03 NOTE — Progress Notes (Signed)
[facility]   Provider:  Larey Seat, MD   Referring Provider: Marton Redwood, MD Primary Care Physician:  Marton Redwood, MD  Chief Complaint  Patient presents with  . Follow-up    pt alone, rm 11. pt states machine is working well, no issues. DME [facility]    HPI:  Danny Kennedy is a 74 y.o. male patient is seen here for a yearly revisit with compliance of CPAP in the treatment of sleep apnea.   01-03-2020: Danny Kennedy, is a 74 year old Caucasian gentleman, married and longtime patient of Dr. Steffanie Rainwater.  He has been followed for obstructive sleep apnea in this office. He was seen in the ED for Pneumonia- had tested negative for COVID 9- son an daughter in law were infected.   Danny Kennedy has been a very compliant CPAP user and uses an air sense AutoSet machine is a serial #[id number].  He has been using the machine 67 out of 90 days and 82% over 60 days.  Average use at time of all days is 5 hours 7 minutes, minimum pressure 6 maximum pressure 12 3 cm expiratory pressure relief his residual AHI is very low at only 0.4/h.  The 95th percentile pressure is 11.8.  He does not have central apneas arising.  Based on this the patient does not need any changes in his settings.  Please note that the patient has not done the second CPAP machine that he keeps at his second home and that does protocols merged together meet up to a 97% compliance.  10-05-2019, Danny Kennedy is a 74 y.o. male patient is seen here for a yearly revisit with compliance of CPAP in the treatment of sleep apnea. 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  He had to shut down his company for 10 days as so many employees had Covid 19.  He developed pneumonia , not COVID related and was his home treated for 5 days.  He has now been on this CPAP for 20 plus years. His current machine is 74 year old .    Danny Kennedy has been a compliant  CPAP user from the start and this year his compliance report is no different, he averaged 97% compliance with an average of 6 hours 45 minutes he is using an AutoSet that is now 74 years old with a minimum pressure of 6 and a maximum pressure of 11 cmH2O and 1 cm EPR.   The AHI is 0.5 which speaks for an excellent resolution and the 95th percentile pressure of 10.9 cmH2O he has minimal air leakage.  There are no central apneas arising.  I encouraged him to keep his machine but he is entitled to a new one and I will again write for an auto titration set with the same settings as currently used since he tolerates those very well. How likely are you to doze in the following situations: 0 = not likely, 1 = slight chance, 2 = moderate chance, 3 = high chance  Sitting and Reading? Watching Television? Sitting inactive in a public place (theater or meeting)? Lying down in the afternoon when circumstances permit? Sitting and talking to someone? Sitting quietly after lunch without alcohol? In a car, while stopped for a few minutes in traffic? As a passenger in a car for an hour without a break?  Total =5/ 24    09-21-2018 high compliance  on CPAP, god resolution. He lost 16 pounds with an appendectomy July 2019.   He never had the classic symptoms of an appendicitis, he developed dry heaves and only after repeated palpation of the abdomen was a trigger point found, it was not a classical McBurney spot.  He never have the typical pain, but he developed low-grade continued fevers after appendectomy and had developed an abscess.  As to his CPAP compliance was 83% for the last 30 days with an average daily usage time of 5 hours 48 minutes, AutoSet between 6 and 11 cmH2O is used with 1 cm EPR, AHI was 0.5/h 95th percentile pressure is 10.9 cmH2O he has minimal air leakage, he appears neither depressed nor excessively daytime sleepy Epworth sleepiness score was endorsed at 7 points and the fatigue severity scale at  16 points.  He has his second (and older CPAP )  machine at his [place]. He needs a new ESON headgear and mask in [place]. His older machine is set on CM 11.   Interval history from 16 September 2017. This is a regular routine yearly follow-up for CPAP compliance with Danny Kennedy.  He has been a compliant CPAP user, alternating between CPAP machines and his second home in his primary home.  Together these reports would shortness of a 97% compliance for hours and 100% 4 days.  Average use at time is 7 hours and 5 minutes.  Residual AHI is 0.4 which is an excellent result.  95th percentile pressure is 10.9 cm water, there are no treatment emergent central apneas noted.  There are no changes necessary.  The patient received supplies for his CPAP through [facility]. The last prescribed machine is 74 years old, the older one is 74 years old, and he is using CPAP for over 20 years.He reports that one night when he forgot to bring his CPAP with him he felt his airway collapsing and actually caused him to panic. He is a very compliant CPAP user.    Consultation note: CD .  Danny Kennedy states that he was diagnosed with obstructive sleep apnea is so many years ago he doesn't really know when exactly the diagnosis was made. Dr. Claiborne Rigg ENT- physician had sent him to [facility] hospital about 18 years ago for a sleep study, he was given the CPAP after this evaluation. Dr. Linna Darner  PCP followed him at the time.  He had [employer] as his DME, but he couldn't get any new masks after not having seen a physician in so many years.  He never had physician follow up in regards to CPAP.  The patient has been using an 11 cm water pressure setting as long as he can remember. He feels that the CPAP is working fine for him , but needs his new equipment. He cannot sleep without his machine. He snores loudly without his machine.   Bedtime is 11 Pm and he will fall asleep promptly ( ' in about 2 minutes '), he wakes once a night  for a bathroom break and sleeps through until 7 AM, spontaneously, no alarm is needed.  During the day , he drinks diet cokes, no coffee.  ETOH intake is 2 beers a week, and no tobacco use.  He doesn't nap. He does have firearms in the house- [place], those a locked up.   08-15-15 compliance download;  the patient has used a CPAP 73% with over 4 hours of consecutive nightly use. The average use is  4 hours and 54 minutes. He regularly does not use this CPAP on Sunday nights, but she spends at his [place]. He has another CPAP machine at that location his overall compliance from both machines is well above 90%. His current machine is set between 6 and 11 cm pressure window was 1 cm expiratory rash or relief or EPR. [place] machine is set at 11 cm water pressure the 95th percentile pressure for this I will total said machine is 10.9. He is doing well with that and has a residual AHI of only 0.4 - no adjustments have to be made. The patient will receive new supplies,  he actually already received some last week from [facility].   He had no falls in the last year, he has not had an elevation of depression he keeps an active lifestyle. He likes to deer hunt. Non smoker, Non drinker.  His wife has had recently some health scare and is currently in rehabilitation working on her ambulation and endurance.  Interval history from 09/16/2016, I had the pleasure of seeing Danny Kennedy today whom I have followed for over 3 years now regularly on CPAP and to has an established diagnosis of obstructive sleep apnea for well over 20 years. He is currently using an AutoSet between 6 and 11 cm water was 1 cm EPR, residual AHI is 0.3. He alternates between 2 different CPAP machines averaging usually 7 hours and 3 minutes user time each night. I was able to review the compliance record of his normal machines which would give him a compliance of 70% however he is using another machine on weekends at the second  home. He endorsed no significant daytime sleepiness, his fatigue severity scale  score was 19 points,  Epworth sleepiness score 3 points and to so-called GE reactive depression score was 1/15 points. His wife had 4 major surgeries in 4 years and has struggled with health. The couple likes to travel.   Review of Systems:  Out of a complete 14 system review, the patient complains of only the following symptoms, and all other reviewed systems are negative.  He endorsed no significant daytime sleepiness, his fatigue severity scale score was endorsed at 6/ 63  points, Epworth sleepiness score 7 points and to  points.  No nocturia, no snoring on CPAP- needs new headgear ( ESON Medium )  , tube and clip.   97% compliance on 2 CPAP machines.  No flowsheet data found.  How likely are you to doze in the following situations: 0 = not likely, 1 = slight chance, 2 = moderate chance, 3 = high chance  Sitting and Reading?1 Watching Television? 1 Sitting inactive in a public place (theater or meeting)? Lying down in the afternoon when circumstances permit? 3 Sitting and talking to someone? Sitting quietly after lunch without alcohol? In a car, while stopped for a few minutes in traffic? As a passenger in a car for an hour without a break?  Total = 6/24      Social History   Socioeconomic History  . Marital status: Married    Spouse name: [name]  . Number of children: 2  . Years of education: College  . Highest education level: Not on file  Occupational History  . Occupation: Curator: other  Tobacco Use  . Smoking status: Former Smoker    Quit date: 11/03/1990    Years since quitting: 29.1  . Smokeless tobacco: Former Systems developer  Types: Chew  . Tobacco comment: Quit 2005  Substance and Sexual Activity  . Alcohol use: Yes    Comment: 2 cans beer a month  . Drug use: No  . Sexual activity: Not on file  Other Topics Concern  . Not on file  Social History Narrative   Patient  is married [name]) and lives at home with his wife.   Patient owns [employer].   Patient has a college education.   Patient has two sons and four grandchildren.   Patient is right-handed.   Patient drinks coffee, Diet cokes about four + a week.               Social Determinants of Health   Financial Resource Strain:   . Difficulty of Paying Living Expenses: Not on file  Food Insecurity:   . Worried About Charity fundraiser in the Last Year: Not on file  . Ran Out of Food in the Last Year: Not on file  Transportation Needs:   . Lack of Transportation (Medical): Not on file  . Lack of Transportation (Non-Medical): Not on file  Physical Activity:   . Days of Exercise per Week: Not on file  . Minutes of Exercise per Session: Not on file  Stress:   . Feeling of Stress : Not on file  Social Connections:   . Frequency of Communication with Friends and Family: Not on file  . Frequency of Social Gatherings with Friends and Family: Not on file  . Attends Religious Services: Not on file  . Active Member of Clubs or Organizations: Not on file  . Attends Archivist Meetings: Not on file  . Marital Status: Not on file  Intimate Partner Violence:   . Fear of Current or Ex-Partner: Not on file  . Emotionally Abused: Not on file  . Physically Abused: Not on file  . Sexually Abused: Not on file    Family History  Problem Relation Age of Onset  . Cancer Father        LUNG ,THROAT  . Esophageal cancer Father   . Hypertension Maternal Grandmother   . Stroke Maternal Grandmother 72  . Diabetes Maternal Grandmother   . Heart attack Paternal Uncle        X2, neither pre 55  . Heart attack Maternal Uncle        not pre 55  . Lung cancer Maternal Grandfather     Past Medical History:  Diagnosis Date  . Alopecia   . Diverticulosis   . Dupuytren contracture    RUE @ thumb  . Gout   . Hyperlipemia    LDL goal = < 100  . Nephrolithiasis [id number]   Dr  Serita Butcher, X 2   . OSA on CPAP   . Sleep apnea    CPAP @ 11cm  . Unspecified essential hypertension 05/17/2009    Past Surgical History:  Procedure Laterality Date  . APPENDECTOMY    . COLONOSCOPY W/ POLYPECTOMY  2005, 2007   x2 negative 10-2007, Dr Sharlett Iles   . IR RADIOLOGIST EVAL & MGMT  06/01/2018  . LAPAROSCOPIC APPENDECTOMY N/A 05/05/2018   Procedure: APPENDECTOMY LAPAROSCOPIC;  Surgeon: Excell Seltzer, MD;  Location: [facility] ORS;  Service: General;  Laterality: N/A;  . renal calculus     retirieved via basket 1984; stone passed spontaneously 2011; Otic tube R TM  . WISDOM TOOTH EXTRACTION      Current Outpatient Medications  Medication Sig Dispense  Refill  . aspirin-acetaminophen-caffeine (EXCEDRIN MIGRAINE) 250-250-65 MG tablet Take 2 tablets by mouth every 6 (six) hours as needed for headache.    . colchicine 0.6 MG tablet Take 0.6 mg by mouth 3 (three) times daily as needed (gout flare up.).    Marland Kitchen famotidine (PEPCID) 20 MG tablet Take 20 mg by mouth 2 (two) times daily.    Marland Kitchen FIBER COMPLETE TABS Take 1 tablet by mouth daily.     . finasteride (PROSCAR) 5 MG tablet take 1 tablet by mouth once daily as directed (Patient taking differently: Take 2.5 mg by mouth daily. ) 30 tablet 1  . gabapentin (NEURONTIN) 300 MG capsule Take 300 mg by mouth at bedtime.    . Glucosamine-Chondroit-Vit C-Mn (GLUCOSAMINE 1500 COMPLEX PO) Take 2 tablets by mouth daily.    Marland Kitchen ipratropium (ATROVENT) 0.03 % nasal spray Place 2 sprays into the nose 3 (three) times daily as needed for allergies.    . multivitamin (THERAGRAN) per tablet Take 1 tablet by mouth daily.      Marland Kitchen olmesartan (BENICAR) 20 MG tablet Take 20 mg by mouth daily.     . pantoprazole (PROTONIX) 20 MG tablet Take 1 tablet (20 mg total) by mouth daily. 30 tablet 0  . pravastatin (PRAVACHOL) 40 MG tablet take 1 tablet by mouth at bedtime 90 tablet 1   No current facility-administered medications for this visit.    Allergies as of 01/03/2020   . (No Known Allergies)    Vitals: BP (!) 142/83   Pulse 88   Temp (!) 97 F (36.1 C)   Ht 5\' 11"  (1.803 m)   Wt 233 lb (105.7 kg)   BMI 32.50 kg/m  Last Weight:  Wt Readings from Last 1 Encounters:  01/03/20 233 lb (105.7 kg)   Last Height:   Ht Readings from Last 1 Encounters:  01/03/20 5\' 11"  (1.803 m)    Physical exam:  General: The patient is awake, alert and appears not in acute distress. The patient is well groomed. Head: Normocephalic, atraumatic. Neck is supple. Mallampati 3, very low and narrow airway, neck circumference: 19 inches. Nasal congestions.  Cardiovascular: Regular rate and rhythm, without murmurs or carotid bruit, and without distended neck veins. Respiratory: Lungs are clear to auscultation. Skin:  no edema, nor rash Trunk: BMI is elevated ( 32) and patient has normal posture.  Neurologic exam : no loss of taste or smell.  The patient is awake and alert, oriented to place and time. Memory subjective described as intact.  There is a normal attention span & concentration ability. Speech is fluent without dysarthria, dysphonia or aphasia. Mood and affect are appropriate. Cranial nerves:Pupils are equal and briskly reactive to light.  No ptosis,  Visual fields by finger perimetry are intact. Hearing to finger rub intact.  Facial motor strength is symmetric and tongue and uvula move midline. Motor exam: Normal tone, muscle bulk and symmetric strength in all extremities at expected level. Bilateral equal grip strength.  Coordination testing shows no tremor or ataxia, normal handwriting.   Assessment:  After physical and neurologic examination, review of laboratory studies, imaging, neurophysiology testing and pre-existing records, assessment is   1) Patient with a 20 plus year history of apnea.has been compliant on auto- CPAP, highly compiant but alternating between 2 machines, one at his [facility] .  Currently happy CPAP user on  11 cm water setting  for his older machine ( at his second home) and  A newer CPAP auto-set  as at home machine, 83% -with a residual AHI of 0. 4. The patient uses an AutoSet between 6 and 11 cm water was 1 cm EPR. He does have minor air leaks.  He does have an older machine at the second home and therefore the compliance is 100% on merged records - from both CPAP's.   Plan:  Treatment plan and additional workup :  1)  Keep using CPAP at the current levels- excellent control of apnea, wife sleeps better, he used N 30 I dreamwear. He feels the nose part is too small.  !   New machine was ordered at last visit 10-05-2019.  , he will keep his older machine at his [place].  Machine number: [id number]. RV in 12 month. Keep up the good compliance.     Larey Seat, MD   Cc: Dr. Carmie Kanner .

## 2020-01-03 NOTE — Patient Instructions (Signed)
COVID-19 Frequently Asked Questions COVID-19 (coronavirus disease) is an infection that is caused by a large family of viruses. Some viruses cause illness in people and others cause illness in animals like camels, cats, and bats. In some cases, the viruses that cause illness in animals can spread to humans. Where did the coronavirus come from? In December 2019, [place] told the World Health Organization (WHO) of several cases of lung disease (human respiratory illness). These cases were linked to an open seafood and livestock market in the city of [place]. The link to the seafood and livestock market suggests that the virus may have spread from animals to humans. However, since that first outbreak in December, the virus has also been shown to spread from person to person. What is the name of the disease and the virus? Disease name Early on, this disease was called novel coronavirus. This is because scientists determined that the disease was caused by a new (novel) respiratory virus. The World Health Organization (WHO) has now named the disease COVID-19, or coronavirus disease. Virus name The virus that causes the disease is called severe acute respiratory syndrome coronavirus 2 (SARS-CoV-2). More information on disease and virus naming World Health Organization (WHO): www.who.int/emergencies/diseases/novel-coronavirus-2019/technical-guidance/naming-the-coronavirus-disease-(covid-2019)-and-the-virus-that-causes-it Who is at risk for complications from coronavirus disease? Some people may be at higher risk for complications from coronavirus disease. This includes older adults and people who have chronic diseases, such as heart disease, diabetes, and lung disease. If you are at higher risk for complications, take these extra precautions:  Stay home as much as possible.  Avoid social gatherings and travel.  Avoid close contact with others. Stay at least 6 ft (2 m) away from others, if possible.  Wash  your hands often with soap and water for at least 20 seconds.  Avoid touching your face, mouth, nose, or eyes.  Keep supplies on hand at home, such as food, medicine, and cleaning supplies.  If you must go out in public, wear a cloth face covering or face mask. Make sure your mask covers your nose and mouth. How does coronavirus disease spread? The virus that causes coronavirus disease spreads easily from person to person (is contagious). You may catch the virus by:  Breathing in droplets from an infected person. Droplets can be spread by a person breathing, speaking, singing, coughing, or sneezing.  Touching something, like a table or a doorknob, that was exposed to the virus (contaminated) and then touching your mouth, nose, or eyes. Can I get the virus from touching surfaces or objects? There is still a lot that we do not know about the virus that causes coronavirus disease. Scientists are basing a lot of information on what they know about similar viruses, such as:  Viruses cannot generally survive on surfaces for long. They need a human body (host) to survive.  It is more likely that the virus is spread by close contact with people who are sick (direct contact), such as through: ? Shaking hands or hugging. ? Breathing in respiratory droplets that travel through the air. Droplets can be spread by a person breathing, speaking, singing, coughing, or sneezing.  It is less likely that the virus is spread when a person touches a surface or object that has the virus on it (indirect contact). The virus may be able to enter the body if the person touches a surface or object and then touches his or her face, eyes, nose, or mouth. Can a person spread the virus without having symptoms of the disease?   It may be possible for the virus to spread before a person has symptoms of the disease, but this is most likely not the main way the virus is spreading. It is more likely for the virus to spread by  being in close contact with people who are sick and breathing in the respiratory droplets spread by a person breathing, speaking, singing, coughing, or sneezing. What are the symptoms of coronavirus disease? Symptoms vary from person to person and can range from mild to severe. Symptoms may include:  Fever or chills.  Cough.  Difficulty breathing or feeling short of breath.  Headaches, body aches, or muscle aches.  Runny or stuffy (congested) nose.  Sore throat.  New loss of taste or smell.  Nausea, vomiting, or diarrhea. These symptoms can appear anywhere from 2 to 14 days after you have been exposed to the virus. Some people may not have any symptoms. If you develop symptoms, call your health care provider. People with severe symptoms may need hospital care. Should I be tested for this virus? Your health care provider will decide whether to test you based on your symptoms, history of exposure, and your risk factors. How does a health care provider test for this virus? Health care providers will collect samples to send for testing. Samples may include:  Taking a swab of fluid from the back of your nose and throat, your nose, or your throat.  Taking fluid from the lungs by having you cough up mucus (sputum) into a sterile cup.  Taking a blood sample. Is there a treatment or vaccine for this virus? Currently, there is no vaccine to prevent coronavirus disease. Also, there are no medicines like antibiotics or antivirals to treat the virus. A person who becomes sick is given supportive care, which means rest and fluids. A person may also relieve his or her symptoms by using over-the-counter medicines that treat sneezing, coughing, and runny nose. These are the same medicines that a person takes for the common cold. If you develop symptoms, call your health care provider. People with severe symptoms may need hospital care. What can I do to protect myself and my family from this  virus?     You can protect yourself and your family by taking the same actions that you would take to prevent the spread of other viruses. Take the following actions:  Wash your hands often with soap and water for at least 20 seconds. If soap and water are not available, use alcohol-based hand sanitizer.  Avoid touching your face, mouth, nose, or eyes.  Cough or sneeze into a tissue, sleeve, or elbow. Do not cough or sneeze into your hand or the air. ? If you cough or sneeze into a tissue, throw it away immediately and wash your hands.  Disinfect objects and surfaces that you frequently touch every day.  Stay away from people who are sick.  Avoid going out in public, follow guidance from your state and local health authorities.  Avoid crowded indoor spaces. Stay at least 6 ft (2 m) away from others.  If you must go out in public, wear a cloth face covering or face mask. Make sure your mask covers your nose and mouth.  Stay home if you are sick, except to get medical care. Call your health care provider before you get medical care. Your health care provider will tell you how long to stay home.  Make sure your vaccines are up to date. Ask your health care provider what   vaccines you need. What should I do if I need to travel? Follow travel recommendations from your local health authority, the CDC, and WHO. Travel information and advice  Centers for Disease Control and Prevention (CDC): www.cdc.gov/coronavirus/2019-ncov/travelers/index.html  World Health Organization (WHO): www.who.int/emergencies/diseases/novel-coronavirus-2019/travel-advice Know the risks and take action to protect your health  You are at higher risk of getting coronavirus disease if you are traveling to areas with an outbreak or if you are exposed to travelers from areas with an outbreak.  Wash your hands often and practice good hygiene to lower the risk of catching or spreading the virus. What should I do if I  am sick? General instructions to stop the spread of infection  Wash your hands often with soap and water for at least 20 seconds. If soap and water are not available, use alcohol-based hand sanitizer.  Cough or sneeze into a tissue, sleeve, or elbow. Do not cough or sneeze into your hand or the air.  If you cough or sneeze into a tissue, throw it away immediately and wash your hands.  Stay home unless you must get medical care. Call your health care provider or local health authority before you get medical care.  Avoid public areas. Do not take public transportation, if possible.  If you can, wear a mask if you must go out of the house or if you are in close contact with someone who is not sick. Make sure your mask covers your nose and mouth. Keep your home clean  Disinfect objects and surfaces that are frequently touched every day. This may include: ? Counters and tables. ? Doorknobs and light switches. ? Sinks and faucets. ? Electronics such as phones, remote controls, keyboards, computers, and tablets.  Wash dishes in hot, soapy water or use a dishwasher. Air-dry your dishes.  Wash laundry in hot water. Prevent infecting other household members  Let healthy household members care for children and pets, if possible. If you have to care for children or pets, wash your hands often and wear a mask.  Sleep in a different bedroom or bed, if possible.  Do not share personal items, such as razors, toothbrushes, deodorant, combs, brushes, towels, and washcloths. Where to find more information Centers for Disease Control and Prevention (CDC)  Information and news updates: www.cdc.gov/coronavirus/2019-ncov World Health Organization (WHO)  Information and news updates: www.who.int/emergencies/diseases/novel-coronavirus-2019  Coronavirus health topic: www.who.int/health-topics/coronavirus  Questions and answers on COVID-19: www.who.int/news-room/q-a-detail/q-a-coronaviruses  Global  tracker: who.sprinklr.com American Academy of Pediatrics (AAP)  Information for families: www.healthychildren.org/English/health-issues/conditions/chest-lungs/Pages/2019-Novel-Coronavirus.aspx The coronavirus situation is changing rapidly. Check your local health authority website or the CDC and WHO websites for updates and news. When should I contact a health care provider?  Contact your health care provider if you have symptoms of an infection, such as fever or cough, and you: ? Have been near anyone who is known to have coronavirus disease. ? Have come into contact with a person who is suspected to have coronavirus disease. ? Have traveled to an area where there is an outbreak of COVID-19. When should I get emergency medical care?  Get help right away by calling your local emergency services (911 in the [place] if you have: ? Trouble breathing. ? Pain or pressure in your chest. ? Confusion. ? Blue-tinged lips and fingernails. ? Difficulty waking from sleep. ? Symptoms that get worse. Let the emergency medical personnel know if you think you have coronavirus disease. Summary  A new respiratory virus is spreading from person to person and causing   COVID-19 (coronavirus disease).  The virus that causes COVID-19 appears to spread easily. It spreads from one person to another through droplets from breathing, speaking, singing, coughing, or sneezing.  Older adults and those with chronic diseases are at higher risk of disease. If you are at higher risk for complications, take extra precautions.  There is currently no vaccine to prevent coronavirus disease. There are no medicines, such as antibiotics or antivirals, to treat the virus.  You can protect yourself and your family by washing your hands often, avoiding touching your face, and covering your coughs and sneezes. This information is not intended to replace advice given to you by your health care provider. Make sure you discuss any  questions you have with your health care provider. Document Revised: 08/19/2019 Document Reviewed: 02/15/2019 Elsevier Patient Education  [place]. CPAP and BPAP Information CPAP and BPAP are methods of helping a person breathe with the use of air pressure. CPAP stands for "continuous positive airway pressure." BPAP stands for "bi-level positive airway pressure." In both methods, air is blown through your nose or mouth and into your air passages to help you breathe well. CPAP and BPAP use different amounts of pressure to blow air. With CPAP, the amount of pressure stays the same while you breathe in and out. With BPAP, the amount of pressure is increased when you breathe in (inhale) so that you can take larger breaths. Your health care provider will recommend whether CPAP or BPAP would be more helpful for you. Why are CPAP and BPAP treatments used? CPAP or BPAP can be helpful if you have:  Sleep apnea.  Chronic obstructive pulmonary disease (COPD).  Heart failure.  Medical conditions that weaken the muscles of the chest including muscular dystrophy, or neurological diseases such as amyotrophic lateral sclerosis (ALS).  Other problems that cause breathing to be weak, abnormal, or difficult. CPAP is most commonly used for obstructive sleep apnea (OSA) to keep the airways from collapsing when the muscles relax during sleep. How is CPAP or BPAP administered? Both CPAP and BPAP are provided by a small machine with a flexible plastic tube that attaches to a plastic mask. You wear the mask. Air is blown through the mask into your nose or mouth. The amount of pressure that is used to blow the air can be adjusted on the machine. Your health care provider will determine the pressure setting that should be used based on your individual needs. When should CPAP or BPAP be used? In most cases, the mask only needs to be worn during sleep. Generally, the mask needs to be worn throughout the night and  during any daytime naps. People with certain medical conditions may also need to wear the mask at other times when they are awake. Follow instructions from your health care provider about when to use the machine. What are some tips for using the mask?   Because the mask needs to be snug, some people feel trapped or closed-in (claustrophobic) when first using the mask. If you feel this way, you may need to get used to the mask. One way to do this is by holding the mask loosely over your nose or mouth and then gradually applying the mask more snugly. You can also gradually increase the amount of time that you use the mask.  Masks are available in various types and sizes. Some fit over your mouth and nose while others fit over just your nose. If your mask does not fit well, talk  with your health care provider about getting a different one.  If you are using a mask that fits over your nose and you tend to breathe through your mouth, a chin strap may be applied to help keep your mouth closed.  The CPAP and BPAP machines have alarms that may sound if the mask comes off or develops a leak.  If you have trouble with the mask, it is very important that you talk with your health care provider about finding a way to make the mask easier to tolerate. Do not stop using the mask. Stopping the use of the mask could have a negative impact on your health. What are some tips for using the machine?  Place your CPAP or BPAP machine on a secure table or stand near an electrical outlet.  Know where the on/off switch is located on the machine.  Follow instructions from your health care provider about how to set the pressure on your machine and when you should use it.  Do not eat or drink while the CPAP or BPAP machine is on. Food or fluids could get pushed into your lungs by the pressure of the CPAP or BPAP.  Do not smoke. Tobacco smoke residue can damage the machine.  For home use, CPAP and BPAP machines can be  rented or purchased through home health care companies. Many different brands of machines are available. Renting a machine before purchasing may help you find out which particular machine works well for you.  Keep the CPAP or BPAP machine and attachments clean. Ask your health care provider for specific instructions. Get help right away if:  You have redness or open areas around your nose or mouth where the mask fits.  You have trouble using the CPAP or BPAP machine.  You cannot tolerate wearing the CPAP or BPAP mask.  You have pain, discomfort, and bloating in your abdomen. Summary  CPAP and BPAP are methods of helping a person breathe with the use of air pressure.  Both CPAP and BPAP are provided by a small machine with a flexible plastic tube that attaches to a plastic mask.  If you have trouble with the mask, it is very important that you talk with your health care provider about finding a way to make the mask easier to tolerate. This information is not intended to replace advice given to you by your health care provider. Make sure you discuss any questions you have with your health care provider. Document Revised: 02/09/2019 Document Reviewed: 09/08/2016 Elsevier Patient Education  [place].

## 2020-01-04 ENCOUNTER — Ambulatory Visit: Payer: Medicare Other | Admitting: Neurology

## 2020-01-13 DIAGNOSIS — M109 Gout, unspecified: Secondary | ICD-10-CM | POA: Diagnosis not present

## 2020-01-13 DIAGNOSIS — M79672 Pain in left foot: Secondary | ICD-10-CM | POA: Diagnosis not present

## 2020-01-13 DIAGNOSIS — N1831 Chronic kidney disease, stage 3a: Secondary | ICD-10-CM | POA: Diagnosis not present

## 2020-04-10 DIAGNOSIS — H5203 Hypermetropia, bilateral: Secondary | ICD-10-CM | POA: Diagnosis not present

## 2020-04-10 DIAGNOSIS — H25813 Combined forms of age-related cataract, bilateral: Secondary | ICD-10-CM | POA: Diagnosis not present

## 2020-04-10 DIAGNOSIS — H52203 Unspecified astigmatism, bilateral: Secondary | ICD-10-CM | POA: Diagnosis not present

## 2020-04-10 DIAGNOSIS — H524 Presbyopia: Secondary | ICD-10-CM | POA: Diagnosis not present

## 2020-06-30 DIAGNOSIS — Z23 Encounter for immunization: Secondary | ICD-10-CM | POA: Diagnosis not present

## 2020-07-19 DIAGNOSIS — E785 Hyperlipidemia, unspecified: Secondary | ICD-10-CM | POA: Diagnosis not present

## 2020-07-19 DIAGNOSIS — Z125 Encounter for screening for malignant neoplasm of prostate: Secondary | ICD-10-CM | POA: Diagnosis not present

## 2020-07-19 DIAGNOSIS — M109 Gout, unspecified: Secondary | ICD-10-CM | POA: Diagnosis not present

## 2020-07-19 DIAGNOSIS — R739 Hyperglycemia, unspecified: Secondary | ICD-10-CM | POA: Diagnosis not present

## 2020-07-26 DIAGNOSIS — M109 Gout, unspecified: Secondary | ICD-10-CM | POA: Diagnosis not present

## 2020-07-26 DIAGNOSIS — L985 Mucinosis of the skin: Secondary | ICD-10-CM | POA: Diagnosis not present

## 2020-07-26 DIAGNOSIS — Z1339 Encounter for screening examination for other mental health and behavioral disorders: Secondary | ICD-10-CM | POA: Diagnosis not present

## 2020-07-26 DIAGNOSIS — L570 Actinic keratosis: Secondary | ICD-10-CM | POA: Diagnosis not present

## 2020-07-26 DIAGNOSIS — M159 Polyosteoarthritis, unspecified: Secondary | ICD-10-CM | POA: Diagnosis not present

## 2020-07-26 DIAGNOSIS — D224 Melanocytic nevi of scalp and neck: Secondary | ICD-10-CM | POA: Diagnosis not present

## 2020-07-26 DIAGNOSIS — Z1331 Encounter for screening for depression: Secondary | ICD-10-CM | POA: Diagnosis not present

## 2020-07-26 DIAGNOSIS — I129 Hypertensive chronic kidney disease with stage 1 through stage 4 chronic kidney disease, or unspecified chronic kidney disease: Secondary | ICD-10-CM | POA: Diagnosis not present

## 2020-07-26 DIAGNOSIS — J302 Other seasonal allergic rhinitis: Secondary | ICD-10-CM | POA: Diagnosis not present

## 2020-07-26 DIAGNOSIS — E8881 Metabolic syndrome: Secondary | ICD-10-CM | POA: Diagnosis not present

## 2020-07-26 DIAGNOSIS — L989 Disorder of the skin and subcutaneous tissue, unspecified: Secondary | ICD-10-CM | POA: Diagnosis not present

## 2020-07-26 DIAGNOSIS — R809 Proteinuria, unspecified: Secondary | ICD-10-CM | POA: Diagnosis not present

## 2020-07-26 DIAGNOSIS — N1831 Chronic kidney disease, stage 3a: Secondary | ICD-10-CM | POA: Diagnosis not present

## 2020-07-26 DIAGNOSIS — Z85828 Personal history of other malignant neoplasm of skin: Secondary | ICD-10-CM | POA: Diagnosis not present

## 2020-07-26 DIAGNOSIS — L821 Other seborrheic keratosis: Secondary | ICD-10-CM | POA: Diagnosis not present

## 2020-07-26 DIAGNOSIS — Z0000 Encounter for general adult medical examination without abnormal findings: Secondary | ICD-10-CM | POA: Diagnosis not present

## 2020-07-26 DIAGNOSIS — R82998 Other abnormal findings in urine: Secondary | ICD-10-CM | POA: Diagnosis not present

## 2020-07-26 DIAGNOSIS — R7301 Impaired fasting glucose: Secondary | ICD-10-CM | POA: Diagnosis not present

## 2020-07-26 DIAGNOSIS — I10 Essential (primary) hypertension: Secondary | ICD-10-CM | POA: Diagnosis not present

## 2020-07-26 DIAGNOSIS — E669 Obesity, unspecified: Secondary | ICD-10-CM | POA: Diagnosis not present

## 2020-07-26 DIAGNOSIS — E785 Hyperlipidemia, unspecified: Secondary | ICD-10-CM | POA: Diagnosis not present

## 2020-12-20 DIAGNOSIS — L570 Actinic keratosis: Secondary | ICD-10-CM | POA: Diagnosis not present

## 2020-12-20 DIAGNOSIS — L438 Other lichen planus: Secondary | ICD-10-CM | POA: Diagnosis not present

## 2020-12-20 DIAGNOSIS — Z85828 Personal history of other malignant neoplasm of skin: Secondary | ICD-10-CM | POA: Diagnosis not present

## 2021-01-02 ENCOUNTER — Ambulatory Visit: Payer: Medicare Other | Admitting: Adult Health

## 2021-01-18 ENCOUNTER — Telehealth: Payer: Self-pay | Admitting: Neurology

## 2021-01-18 NOTE — Telephone Encounter (Signed)
Pt. states he needs a call back from RN regarding CPAP machine harness. Please advise.

## 2021-01-21 NOTE — Telephone Encounter (Signed)
Called the patient back, he was only looking to see what DME company.  States he was able to finally figure that out and contacted the company to get supplies sent to him.  [employer] is the company that he uses.  Patient has an upcoming yearly appointment that is scheduled in June.

## 2021-02-10 IMAGING — DX DG CHEST 2V
2 series · 2 of 2 positions shown · non-contrast
Comparison: CT abdomen pelvis 06/01/2018

CLINICAL DATA: Shortness of breath, epigastric pain

EXAM:
CHEST - 2 VIEW

[w chest pa]
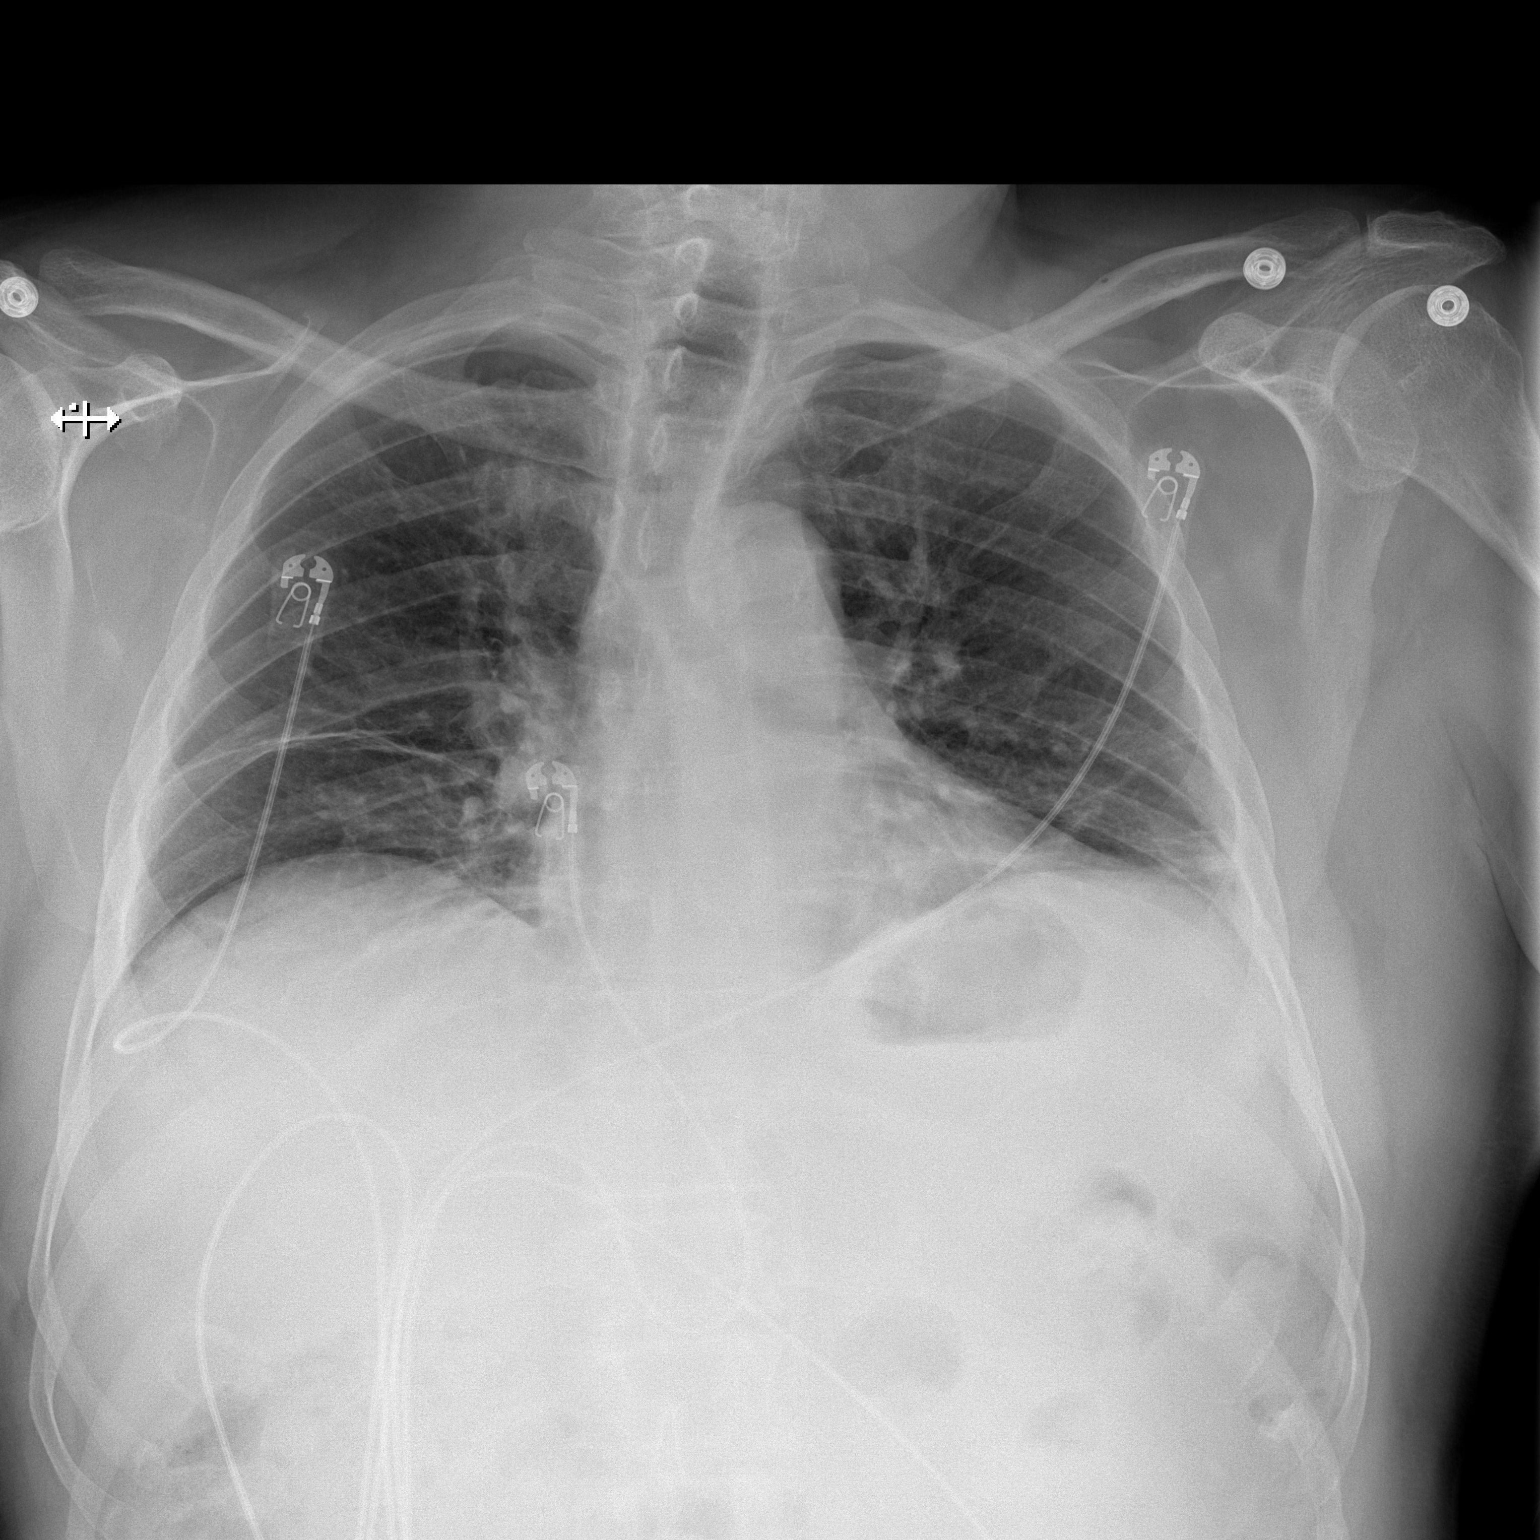

[w chest lat]
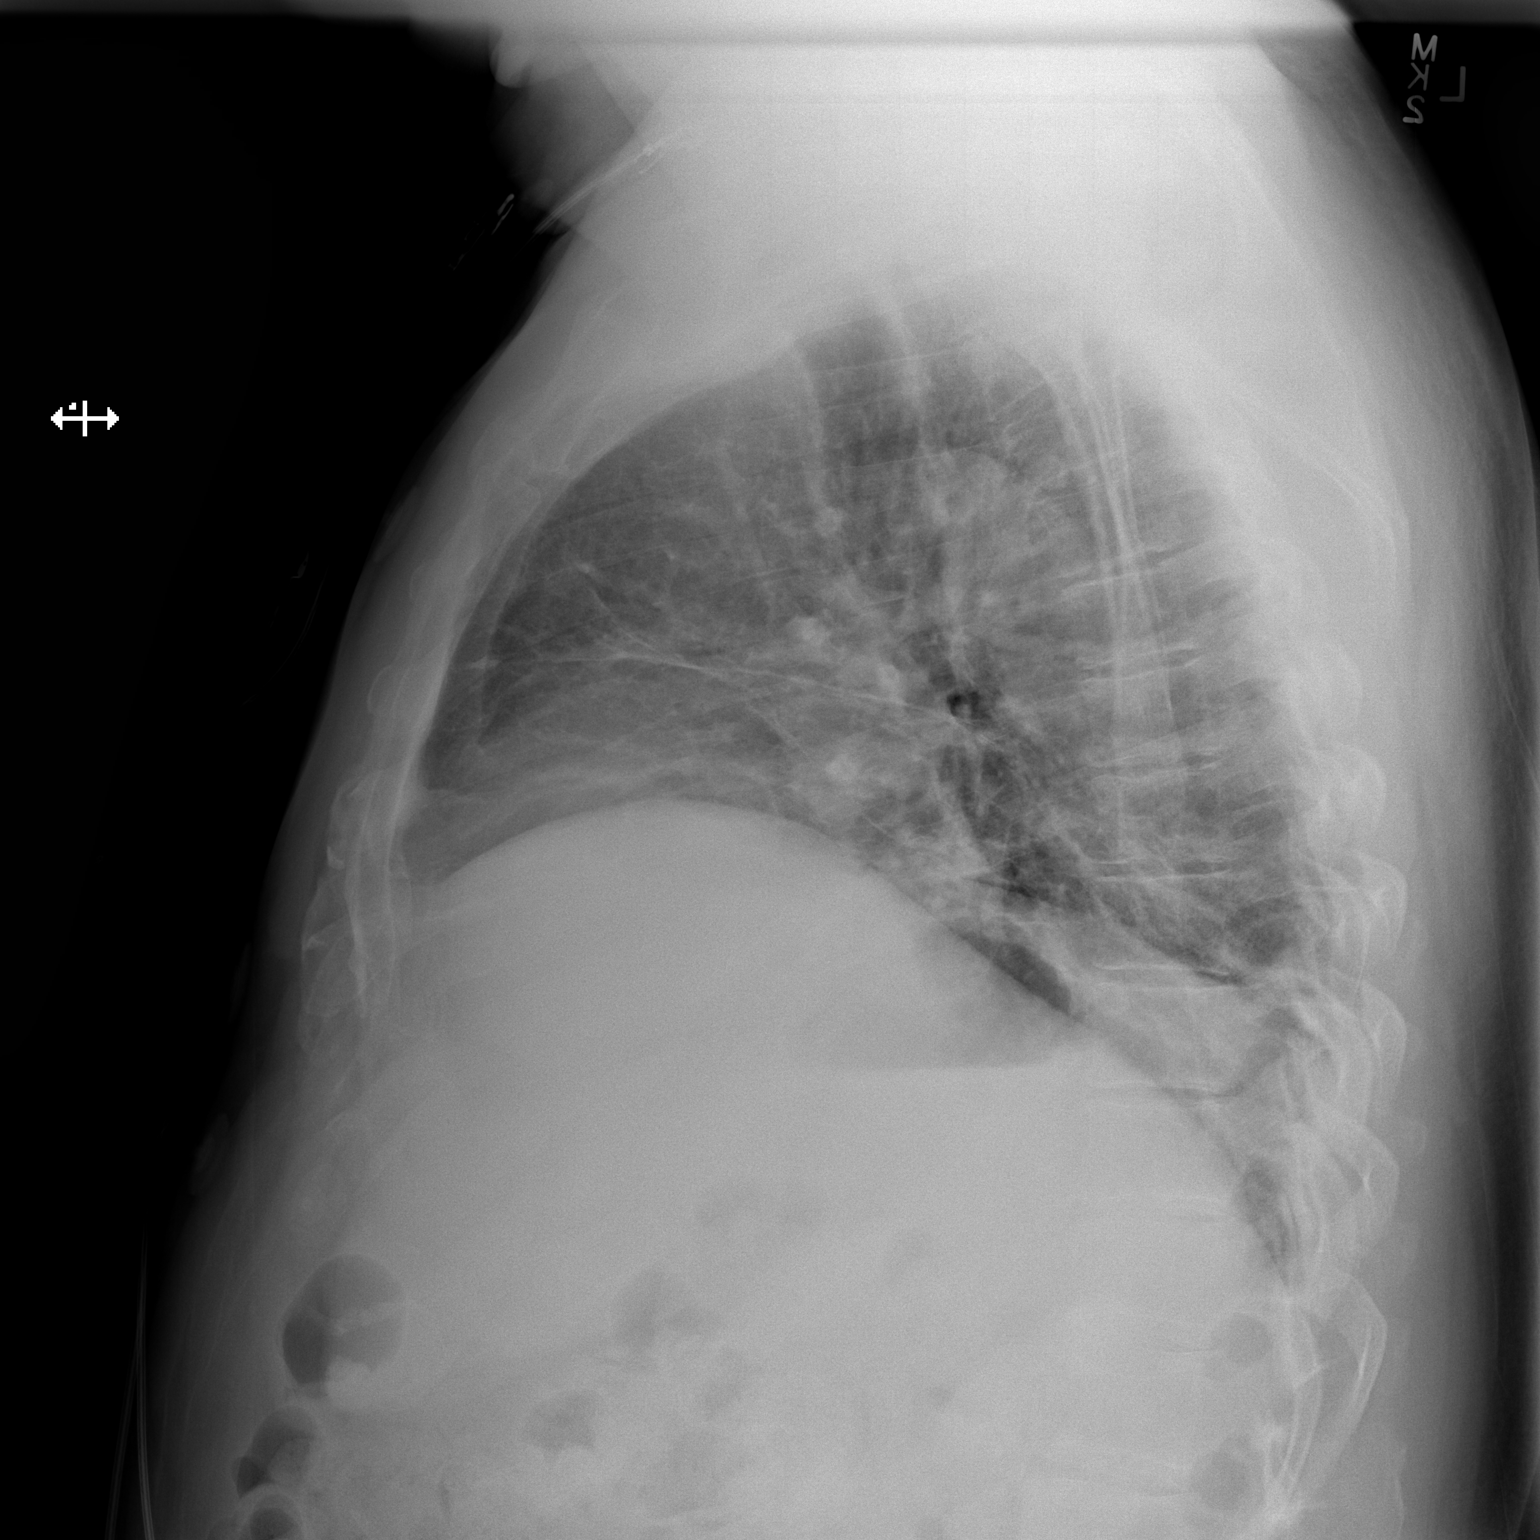

[2 of 2 positions shown; findings below may reference images not displayed]

FINDINGS: Low lung volumes with streaky basilar areas of atelectasis in the
lung bases. No pneumothorax or effusion. The cardiomediastinal
contours are unremarkable. No acute osseous abnormality or
suspicious osseous lesion. Carotid vascular calcifications. No acute
osseous or soft tissue abnormality.
IMPRESSION: Low lung volumes with streaky opacities favoring atelectasis.

## 2021-02-10 IMAGING — CT CT ABD-PELV W/ CM
2 of 5 series · 16 of 46 positions shown, 18 images · IV contrast (omnipaque)
Comparison: May 02, 2018

CLINICAL DATA: Acute abdominal pain. Epigastric pain.

EXAM:
CT ABDOMEN AND PELVIS WITH CONTRAST
TECHNIQUE: Multidetector CT imaging of the abdomen and pelvis was performed
using the standard protocol following bolus administration of
intravenous contrast.
CONTRAST:  100mL OMNIPAQUE IOHEXOL 300 MG/ML  SOLN

[Series 3: abdomen 5.0 · axial · 0.97mm/px · z∈[+864,+1329]mm · 13 of 107 slices shown, 15 images]
[im 7/107  soft-tissue]
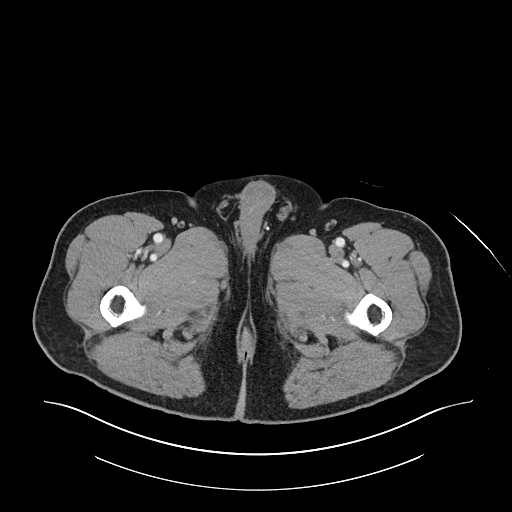
[im 7/107  bone]
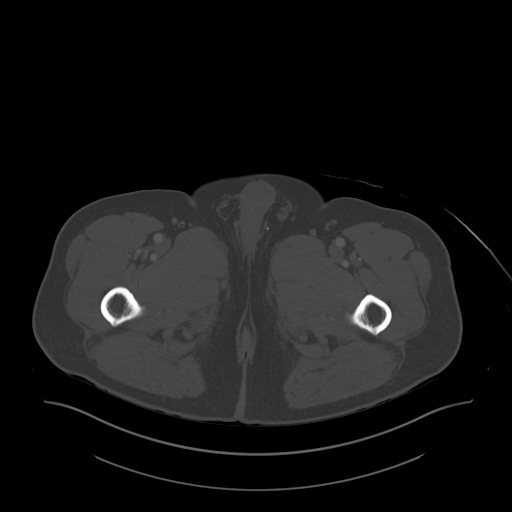
[im 14/107  soft-tissue]
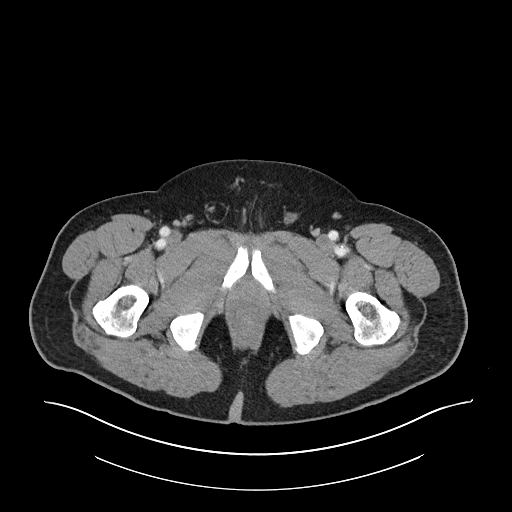
[im 20/107  soft-tissue]
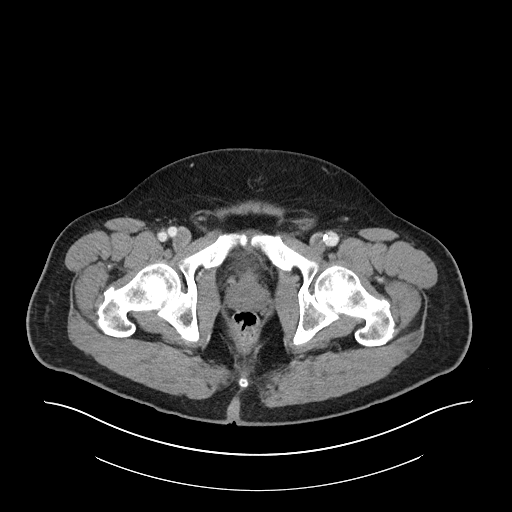
[im 34/107  soft-tissue]
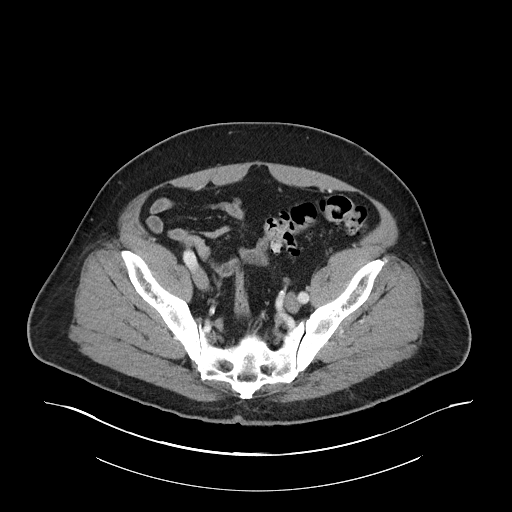
[im 40/107  soft-tissue]
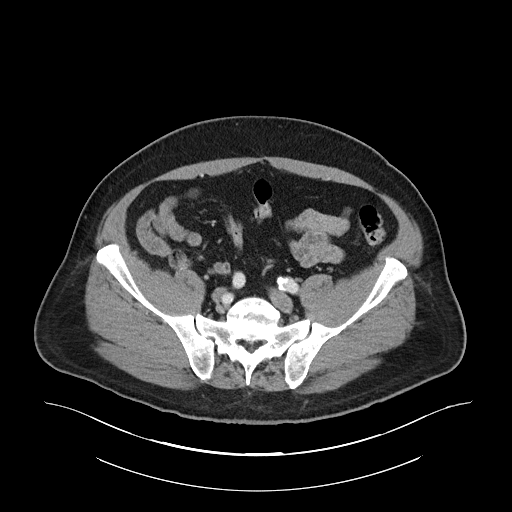
[im 47/107  soft-tissue]
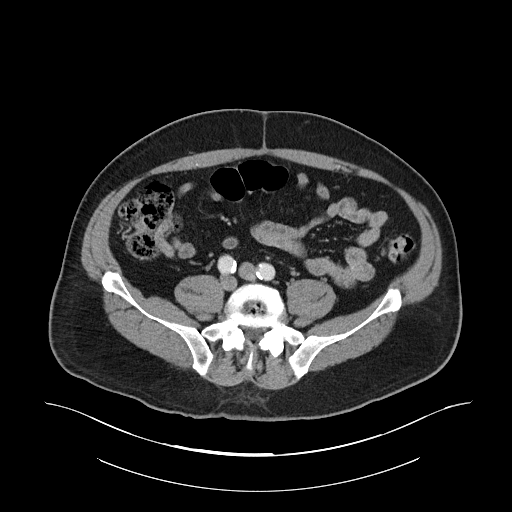
[im 54/107  soft-tissue]
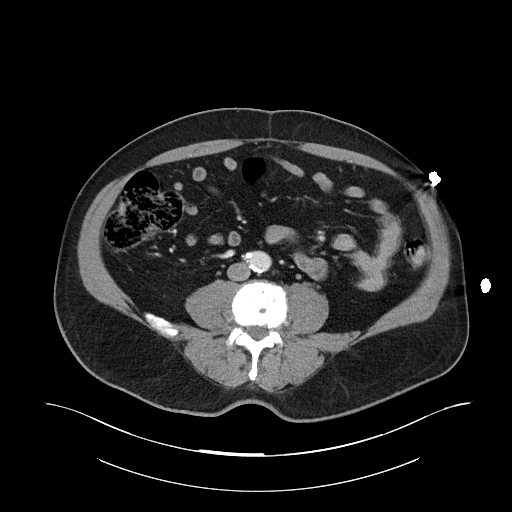
[im 60/107  soft-tissue]
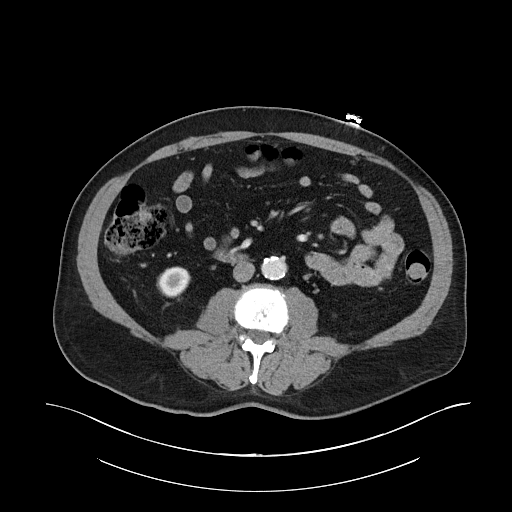
[im 67/107  soft-tissue]
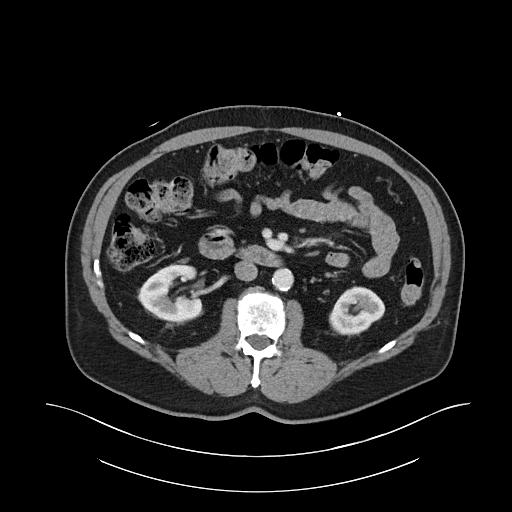
[im 67/107  bone]
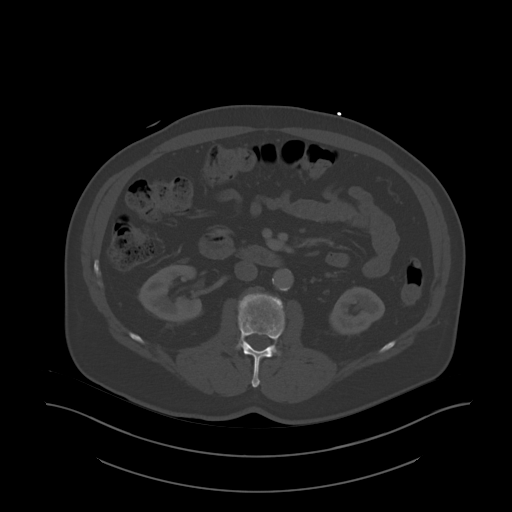
[im 73/107  soft-tissue]
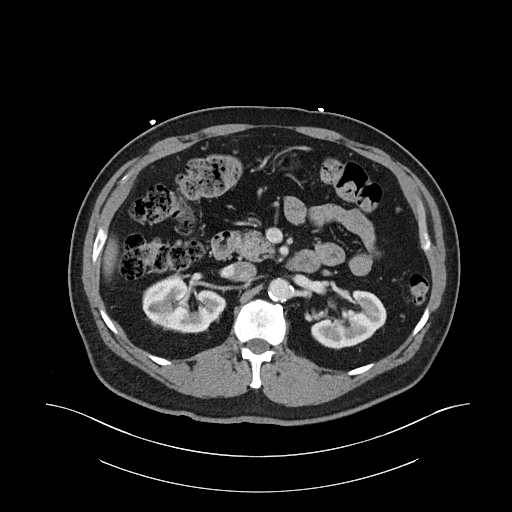
[im 87/107  soft-tissue]
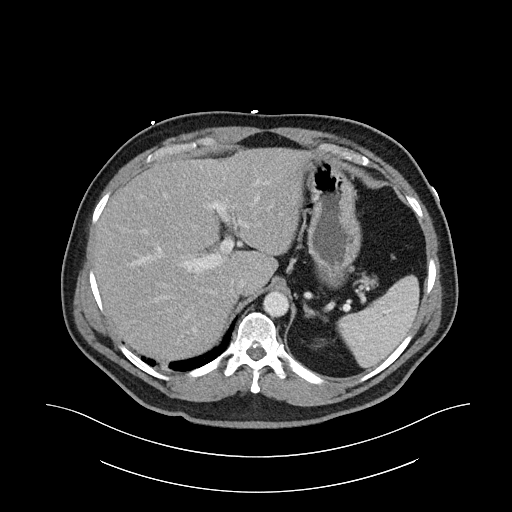
[im 93/107  soft-tissue]
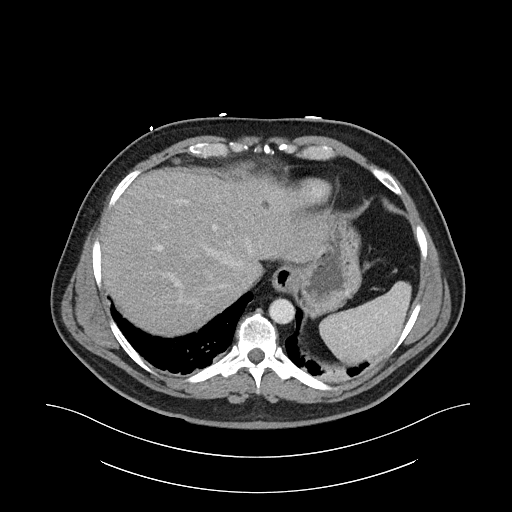
[im 100/107  soft-tissue]
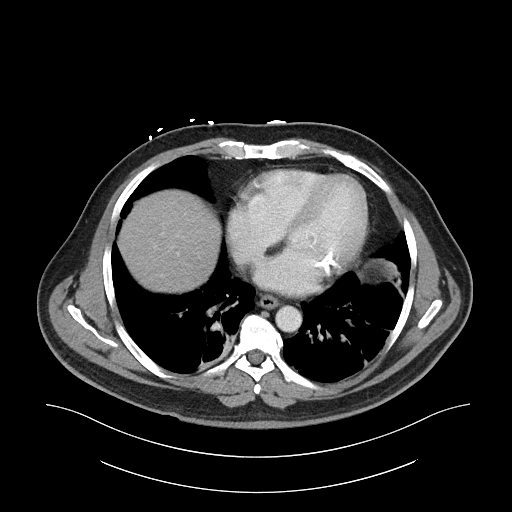

[Series 6: abdomen 3.0 mpr cor · coronal · 0.88mm/px · 3 of 116 slices shown]
[im 39/116  soft-tissue]
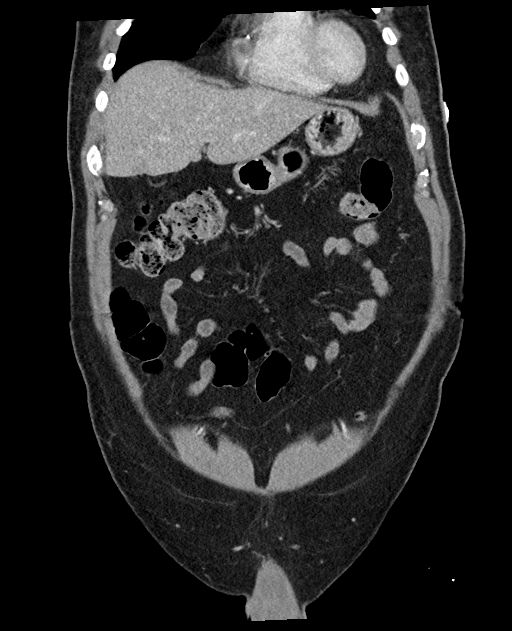
[im 52/116  soft-tissue]
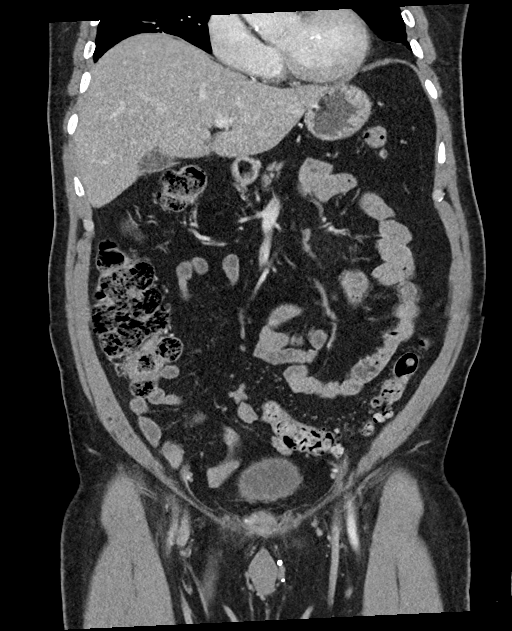
[im 64/116  soft-tissue]
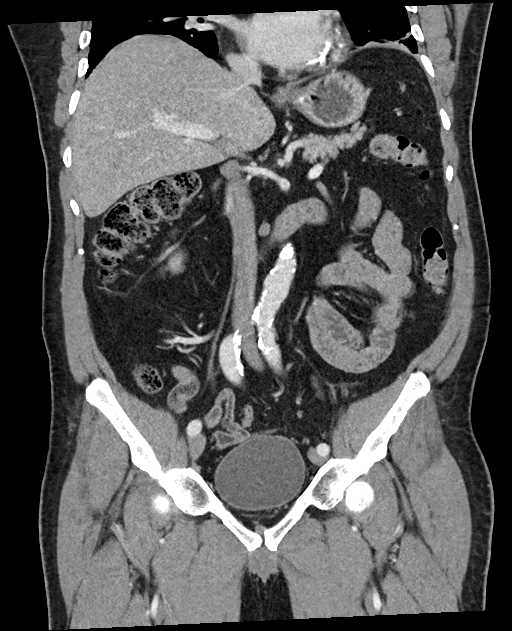

[16 of 46 positions shown; findings below may reference images not displayed]

FINDINGS: Lower chest: There are linear airspace opacities at the lung bases
bilaterally favored to represent atelectasis.The heart size is
normal.

Hepatobiliary: The liver is normal. Normal gallbladder.There is no
biliary ductal dilation.

Pancreas: Normal contours without ductal dilatation. No
peripancreatic fluid collection.

Spleen: No splenic laceration or hematoma.

Adrenals/Urinary Tract:

--Adrenal glands: No adrenal hemorrhage.

--Right kidney/ureter: There is a punctate nonobstructing stone in
the upper pole the right kidney.

--Left kidney/ureter: No hydronephrosis or perinephric hematoma.

--Urinary bladder: Unremarkable.

Stomach/Bowel:

--Stomach/Duodenum: No hiatal hernia or other gastric abnormality.
Normal duodenal course and caliber.

--Small bowel: No dilatation or inflammation.

--Colon: Rectosigmoid diverticulosis without acute inflammation.

--Appendix: Surgically absent.

Vascular/Lymphatic: Atherosclerotic calcification is present within
the non-aneurysmal abdominal aorta, without hemodynamically
significant stenosis.

--No retroperitoneal lymphadenopathy.

--No mesenteric lymphadenopathy.

--No pelvic or inguinal lymphadenopathy.

Reproductive: Unremarkable

Other: No ascites or free air. There is a small right-sided fat
containing inguinal hernia.

Musculoskeletal. No acute displaced fractures.
IMPRESSION: 1. No acute abdominopelvic abnormality.
2. Rectosigmoid diverticulosis without acute inflammation.
3. Aortic atherosclerosis.
4. Nonobstructive right nephrolithiasis.
5. Linear airspace opacities at the lung bases bilaterally favored
to represent atelectasis. An infiltrate is not entirely excluded.

Aortic Atherosclerosis ([id number]).

## 2021-04-09 DIAGNOSIS — H25813 Combined forms of age-related cataract, bilateral: Secondary | ICD-10-CM | POA: Diagnosis not present

## 2021-04-24 ENCOUNTER — Encounter: Payer: Self-pay | Admitting: Adult Health

## 2021-04-24 ENCOUNTER — Other Ambulatory Visit: Payer: Self-pay

## 2021-04-24 ENCOUNTER — Ambulatory Visit (INDEPENDENT_AMBULATORY_CARE_PROVIDER_SITE_OTHER): Payer: Medicare Other | Admitting: Adult Health

## 2021-04-24 VITALS — BP 135/73 | HR 85 | Ht 71.0 in | Wt 232.0 lb

## 2021-04-24 DIAGNOSIS — Z9989 Dependence on other enabling machines and devices: Secondary | ICD-10-CM

## 2021-04-24 DIAGNOSIS — G4733 Obstructive sleep apnea (adult) (pediatric): Secondary | ICD-10-CM

## 2021-04-24 NOTE — Progress Notes (Signed)
PATIENT: Danny Kennedy DOB: 08/21/1946  REASON FOR VISIT: follow up HISTORY FROM: patient PRIMARY NEUROLOGIST:   HISTORY OF PRESENT ILLNESS: Today 04/24/21:  [name] is a 75 year old male with a history of obstructive sleep apnea on CPAP.  He returns today for follow-up.  Reports that the CPAP is working well for him.  Patient denies any new issues.    HISTORY   01-03-2020:(Copied from Dr.Dohmeier's note)  Danny Kennedy, is a 75 year old Caucasian gentleman, married and longtime patient of Dr. Steffanie Rainwater.  He has been followed for obstructive sleep apnea in this office. He was seen in the ED for Pneumonia- had tested negative for COVID 65- son an daughter in law were infected.    [name] has been a very compliant CPAP user and uses an air sense AutoSet machine is a serial #[id number].  He has been using the machine 67 out of 90 days and 82% over 60 days.  Average use at time of all days is 5 hours 7 minutes, minimum pressure 6 maximum pressure 12 3 cm expiratory pressure relief his residual AHI is very low at only 0.4/h.  The 95th percentile pressure is 11.8.  He does not have central apneas arising.  Based on this the patient does not need any changes in his settings.  Please note that the patient has not done the second CPAP machine that he keeps at his second home and that does protocols merged together meet up to a 97% compliance.    REVIEW OF SYSTEMS: Out of a complete 14 system review of symptoms, the patient complains only of the following symptoms, and all other reviewed systems are negative.  ESS 4  ALLERGIES: No Known Allergies  HOME MEDICATIONS: Outpatient Medications Prior to Visit  Medication Sig Dispense Refill   colchicine 0.6 MG tablet Take 0.6 mg by mouth 3 (three) times daily as needed (gout flare up.).     FIBER COMPLETE TABS Take 1 tablet by mouth daily.     finasteride (PROSCAR) 5 MG tablet take 1 tablet by mouth once daily as directed  (Patient taking differently: Take 2.5 mg by mouth daily.) 30 tablet 1   gabapentin (NEURONTIN) 300 MG capsule Take 300 mg by mouth at bedtime.     Glucosamine-Chondroit-Vit C-Mn (GLUCOSAMINE 1500 COMPLEX PO) Take 2 tablets by mouth daily.     multivitamin (THERAGRAN) per tablet Take 1 tablet by mouth daily.       olmesartan (BENICAR) 20 MG tablet Take 20 mg by mouth daily.      pravastatin (PRAVACHOL) 40 MG tablet take 1 tablet by mouth at bedtime 90 tablet 1   aspirin-acetaminophen-caffeine (EXCEDRIN MIGRAINE) 250-250-65 MG tablet Take 2 tablets by mouth every 6 (six) hours as needed for headache.     famotidine (PEPCID) 20 MG tablet Take 20 mg by mouth 2 (two) times daily.     ipratropium (ATROVENT) 0.03 % nasal spray Place 2 sprays into the nose 3 (three) times daily as needed for allergies.     pantoprazole (PROTONIX) 20 MG tablet Take 1 tablet (20 mg total) by mouth daily. 30 tablet 0   No facility-administered medications prior to visit.    PAST MEDICAL HISTORY: Past Medical History:  Diagnosis Date   Alopecia    Diverticulosis    Dupuytren contracture    RUE @ thumb   Gout    Hyperlipemia    LDL goal = < 100   Nephrolithiasis 1191,4782  Dr Serita Butcher, X 2    OSA on CPAP    Sleep apnea    CPAP @ 11cm   Unspecified essential hypertension 05/17/2009    PAST SURGICAL HISTORY: Past Surgical History:  Procedure Laterality Date   APPENDECTOMY     COLONOSCOPY W/ POLYPECTOMY  2005, 2007   x2 negative 10-2007, Dr Sharlett Iles    IR RADIOLOGIST EVAL & MGMT  06/01/2018   LAPAROSCOPIC APPENDECTOMY N/A 05/05/2018   Procedure: APPENDECTOMY LAPAROSCOPIC;  Surgeon: Excell Seltzer, MD;  Location: [facility];  Service: General;  Laterality: N/A;   renal calculus     retirieved via basket 1984; stone passed spontaneously 2011; Otic tube R TM   WISDOM TOOTH EXTRACTION      FAMILY HISTORY: Family History  Problem Relation Age of Onset   Cancer Father        LUNG ,THROAT   Esophageal  cancer Father    Hypertension Maternal Grandmother    Stroke Maternal Grandmother 72   Diabetes Maternal Grandmother    Heart attack Paternal Uncle        X2, neither pre 41   Heart attack Maternal Uncle        not pre 55   Lung cancer Maternal Grandfather     SOCIAL HISTORY: Social History   Socioeconomic History   Marital status: Married    Spouse name: Danny Kennedy   Number of children: 2   Years of education: College   Highest education level: Not on file  Occupational History   Occupation: Curator: other  Tobacco Use   Smoking status: Former    Pack years: 0.00   Smokeless tobacco: Former    Types: Chew   Tobacco comments:    Quit 2005  Substance and Sexual Activity   Alcohol use: Yes    Comment: 2 cans beer a month   Drug use: No   Sexual activity: Not on file  Other Topics Concern   Not on file  Social History Narrative   Patient is married Danny Kennedy) and lives at home with his wife.   Patient owns [employer].   Patient has a college education.   Patient has two sons and four grandchildren.   Patient is right-handed.   Patient drinks coffee, Diet cokes about four + a week.               Social Determinants of Health   Financial Resource Strain: Not on file  Food Insecurity: Not on file  Transportation Needs: Not on file  Physical Activity: Not on file  Stress: Not on file  Social Connections: Not on file  Intimate Partner Violence: Not on file      PHYSICAL EXAM  Vitals:   04/24/21 1054  BP: 135/73  Pulse: 85  Weight: 232 lb (105.2 kg)  Height: 5\' 11"  (1.803 m)   Body mass index is 32.36 kg/m.  Generalized: Well developed, in no acute distress  Chest: Lungs clear to auscultation bilaterally  Neurological examination  Mentation: Alert oriented to time, place, history taking. Follows all commands speech and language fluent Cranial nerve II-XII: Extraocular movements were full, visual field were full on  confrontational test Head turning and shoulder shrug  were normal and symmetric. Motor: The motor testing reveals 5 over 5 strength of all 4 extremities. Good symmetric motor tone is noted throughout.  Sensory: Sensory testing is intact to soft touch on all 4 extremities. No evidence of extinction is noted.  Gait and station: Gait is normal.    DIAGNOSTIC DATA (LABS, IMAGING, TESTING) - I reviewed patient records, labs, notes, testing and imaging myself where available.  Lab Results  Component Value Date   WBC 9.0 09/10/2019   HGB 14.3 09/10/2019   HCT 44.9 09/10/2019   MCV 95.3 09/10/2019   PLT 240 09/10/2019      Component Value Date/Time   NA 138 09/10/2019 1755   K 4.4 09/10/2019 1755   CL 102 09/10/2019 1755   CO2 23 09/10/2019 1755   GLUCOSE 91 09/10/2019 1755   BUN 21 09/10/2019 1755   CREATININE 1.46 (H) 09/10/2019 1755   CALCIUM 9.1 09/10/2019 1755   PROT 7.1 09/10/2019 1755   ALBUMIN 3.9 09/10/2019 1755   AST 24 09/10/2019 1755   ALT 27 09/10/2019 1755   ALKPHOS 48 09/10/2019 1755   BILITOT 0.9 09/10/2019 1755   GFRNONAA 47 (L) 09/10/2019 1755   GFRAA 55 (L) 09/10/2019 1755   Lab Results  Component Value Date   CHOL 129 02/21/2013   HDL 33.90 (L) 02/21/2013   LDLCALC 77 02/21/2013   LDLDIRECT 156.4 04/08/2007   TRIG 93.0 02/21/2013   CHOLHDL 4 02/21/2013   Lab Results  Component Value Date   HGBA1C 6.0 02/21/2013   No results found for: VITAMINB12 Lab Results  Component Value Date   TSH 0.90 02/21/2013      ASSESSMENT AND PLAN 75 y.o. year old male  has a past medical history of Alopecia, Diverticulosis, Dupuytren contracture, Gout, Hyperlipemia, Nephrolithiasis (2952,8413), OSA on CPAP, Sleep apnea, and Unspecified essential hypertension (05/17/2009). here with:  OSA on CPAP  - CPAP compliance excellent - Good treatment of AHI  - Encourage patient to use CPAP nightly and > 4 hours each night - F/U in 1 year or sooner if needed   Ward Givens, MSN, NP-C 04/24/2021, 10:57 AM [facility] [address], [place], [place] [address] [phone]

## 2021-05-08 DIAGNOSIS — S39012A Strain of muscle, fascia and tendon of lower back, initial encounter: Secondary | ICD-10-CM | POA: Diagnosis not present

## 2021-05-08 DIAGNOSIS — I10 Essential (primary) hypertension: Secondary | ICD-10-CM | POA: Diagnosis not present

## 2021-07-30 DIAGNOSIS — L570 Actinic keratosis: Secondary | ICD-10-CM | POA: Diagnosis not present

## 2021-07-30 DIAGNOSIS — Z85828 Personal history of other malignant neoplasm of skin: Secondary | ICD-10-CM | POA: Diagnosis not present

## 2021-07-30 DIAGNOSIS — D225 Melanocytic nevi of trunk: Secondary | ICD-10-CM | POA: Diagnosis not present

## 2021-07-30 DIAGNOSIS — L821 Other seborrheic keratosis: Secondary | ICD-10-CM | POA: Diagnosis not present

## 2021-08-06 DIAGNOSIS — E785 Hyperlipidemia, unspecified: Secondary | ICD-10-CM | POA: Diagnosis not present

## 2021-08-06 DIAGNOSIS — M109 Gout, unspecified: Secondary | ICD-10-CM | POA: Diagnosis not present

## 2021-08-06 DIAGNOSIS — R739 Hyperglycemia, unspecified: Secondary | ICD-10-CM | POA: Diagnosis not present

## 2021-08-06 DIAGNOSIS — Z125 Encounter for screening for malignant neoplasm of prostate: Secondary | ICD-10-CM | POA: Diagnosis not present

## 2021-08-06 DIAGNOSIS — I10 Essential (primary) hypertension: Secondary | ICD-10-CM | POA: Diagnosis not present

## 2021-08-13 DIAGNOSIS — M720 Palmar fascial fibromatosis [Dupuytren]: Secondary | ICD-10-CM | POA: Diagnosis not present

## 2021-08-13 DIAGNOSIS — N1831 Chronic kidney disease, stage 3a: Secondary | ICD-10-CM | POA: Diagnosis not present

## 2021-08-13 DIAGNOSIS — I129 Hypertensive chronic kidney disease with stage 1 through stage 4 chronic kidney disease, or unspecified chronic kidney disease: Secondary | ICD-10-CM | POA: Diagnosis not present

## 2021-08-13 DIAGNOSIS — R82998 Other abnormal findings in urine: Secondary | ICD-10-CM | POA: Diagnosis not present

## 2021-08-13 DIAGNOSIS — Z0000 Encounter for general adult medical examination without abnormal findings: Secondary | ICD-10-CM | POA: Diagnosis not present

## 2021-08-13 DIAGNOSIS — I10 Essential (primary) hypertension: Secondary | ICD-10-CM | POA: Diagnosis not present

## 2021-08-13 DIAGNOSIS — E785 Hyperlipidemia, unspecified: Secondary | ICD-10-CM | POA: Diagnosis not present

## 2021-08-13 DIAGNOSIS — E669 Obesity, unspecified: Secondary | ICD-10-CM | POA: Diagnosis not present

## 2021-08-13 DIAGNOSIS — R809 Proteinuria, unspecified: Secondary | ICD-10-CM | POA: Diagnosis not present

## 2021-08-13 DIAGNOSIS — M25511 Pain in right shoulder: Secondary | ICD-10-CM | POA: Diagnosis not present

## 2021-08-13 DIAGNOSIS — R7301 Impaired fasting glucose: Secondary | ICD-10-CM | POA: Diagnosis not present

## 2021-08-13 DIAGNOSIS — Z1331 Encounter for screening for depression: Secondary | ICD-10-CM | POA: Diagnosis not present

## 2021-08-13 DIAGNOSIS — Z1212 Encounter for screening for malignant neoplasm of rectum: Secondary | ICD-10-CM | POA: Diagnosis not present

## 2021-08-13 DIAGNOSIS — G5762 Lesion of plantar nerve, left lower limb: Secondary | ICD-10-CM | POA: Diagnosis not present

## 2021-12-04 DIAGNOSIS — T148XXA Other injury of unspecified body region, initial encounter: Secondary | ICD-10-CM | POA: Diagnosis not present

## 2022-04-17 DIAGNOSIS — H25813 Combined forms of age-related cataract, bilateral: Secondary | ICD-10-CM | POA: Diagnosis not present

## 2022-04-23 ENCOUNTER — Ambulatory Visit: Payer: Medicare Other | Admitting: Adult Health

## 2022-05-15 DIAGNOSIS — Z85828 Personal history of other malignant neoplasm of skin: Secondary | ICD-10-CM | POA: Diagnosis not present

## 2022-05-15 DIAGNOSIS — S90861A Insect bite (nonvenomous), right foot, initial encounter: Secondary | ICD-10-CM | POA: Diagnosis not present

## 2022-07-10 NOTE — Progress Notes (Unsigned)
PATIENT: Danny Kennedy DOB: 1946/10/18  REASON FOR VISIT: follow up HISTORY FROM: patient PRIMARY NEUROLOGIST: Dr. Brett Fairy  Chief Complaint  Patient presents with   Follow-up    Pt in 75  Pt here for CPAP f/u  Pt wants to discuss results for CPAP DL      HISTORY OF PRESENT ILLNESS: Today 07/14/22:   Danny Kennedy is a 76 year old male with a history of obstructive sleep apnea on CPAP.  He returns today for follow-up.  He reports that he has a second machine that he uses at his [place].  This explains the gaps in his usage.  He states that he uses the machine nightly.  Reports that if he skips a night he has a sore throat.  His download is below    04/24/21: Danny Kennedy is a 76 year old male with a history of obstructive sleep apnea on CPAP.  He returns today for follow-up.  Reports that the CPAP is working well for him.  Patient denies any new issues.    HISTORY   01-03-2020:(Copied from Dr.Dohmeier's note)  Danny Kennedy, is a 76 year old Caucasian gentleman, married and longtime patient of Dr. Steffanie Rainwater.  He has been followed for obstructive sleep apnea in this office. He was seen in the ED for Pneumonia- had tested negative for COVID 26- son an daughter in law were infected.    Danny Kennedy has been a very compliant CPAP user and uses an air sense AutoSet machine is a serial #[id number].  He has been using the machine 67 out of 90 days and 82% over 60 days.  Average use at time of all days is 5 hours 7 minutes, minimum pressure 6 maximum pressure 12 3 cm expiratory pressure relief his residual AHI is very low at only 0.4/h.  The 95th percentile pressure is 11.8.  He does not have central apneas arising.  Based on this the patient does not need any changes in his settings.  Please note that the patient has not done the second CPAP machine that he keeps at his second home and that does protocols merged together meet up to a 97% compliance.    REVIEW OF SYSTEMS: Out of  a complete 14 system review of symptoms, the patient complains only of the following symptoms, and all other reviewed systems are negative.  ESS 4 FSS 16  ALLERGIES: No Known Allergies  HOME MEDICATIONS: Outpatient Medications Prior to Visit  Medication Sig Dispense Refill   colchicine 0.6 MG tablet Take 0.6 mg by mouth 3 (three) times daily as needed (gout flare up.).     FIBER COMPLETE TABS Take 1 tablet by mouth daily.     finasteride (PROSCAR) 5 MG tablet take 1 tablet by mouth once daily as directed (Patient taking differently: Take 2.5 mg by mouth daily.) 30 tablet 1   gabapentin (NEURONTIN) 300 MG capsule Take 300 mg by mouth at bedtime.     Glucosamine-Chondroit-Vit C-Mn (GLUCOSAMINE 1500 COMPLEX PO) Take 2 tablets by mouth daily.     multivitamin (THERAGRAN) per tablet Take 1 tablet by mouth daily.       olmesartan (BENICAR) 20 MG tablet Take 20 mg by mouth daily.      pravastatin (PRAVACHOL) 40 MG tablet take 1 tablet by mouth at bedtime 90 tablet 1   No facility-administered medications prior to visit.    PAST MEDICAL HISTORY: Past Medical History:  Diagnosis Date   Alopecia    Diverticulosis  Dupuytren contracture    RUE @ thumb   Gout    Hyperlipemia    LDL goal = < 100   Nephrolithiasis 2694,8546   Dr Serita Butcher, X 2    OSA on CPAP    Sleep apnea    CPAP @ 11cm   Unspecified essential hypertension 05/17/2009    PAST SURGICAL HISTORY: Past Surgical History:  Procedure Laterality Date   APPENDECTOMY     COLONOSCOPY W/ POLYPECTOMY  2005, 2007   x2 negative 10-2007, Dr Sharlett Iles    IR RADIOLOGIST EVAL & MGMT  06/01/2018   LAPAROSCOPIC APPENDECTOMY N/A 05/05/2018   Procedure: APPENDECTOMY LAPAROSCOPIC;  Surgeon: Excell Seltzer, MD;  Location: [facility];  Service: General;  Laterality: N/A;   renal calculus     retirieved via basket 1984; stone passed spontaneously 2011; Otic tube R TM   WISDOM TOOTH EXTRACTION      FAMILY HISTORY: Family History   Problem Relation Age of Onset   Cancer Father        LUNG ,THROAT   Esophageal cancer Father    Hypertension Maternal Grandmother    Stroke Maternal Grandmother 72   Diabetes Maternal Grandmother    Heart attack Paternal Uncle        X2, neither pre 15   Heart attack Maternal Uncle        not pre 55   Lung cancer Maternal Grandfather     SOCIAL HISTORY: Social History   Socioeconomic History   Marital status: Married    Spouse name: [name]   Number of children: 2   Years of education: College   Highest education level: Not on file  Occupational History   Occupation: Curator: other  Tobacco Use   Smoking status: Former   Smokeless tobacco: Former    Types: Chew   Tobacco comments:    Quit 2005  Substance and Sexual Activity   Alcohol use: Yes    Comment: 2 cans beer a month   Drug use: No   Sexual activity: Not on file  Other Topics Concern   Not on file  Social History Narrative   Patient is married [name]) and lives at home with his wife.   Patient owns [employer].   Patient has a college education.   Patient has two sons and four grandchildren.   Patient is right-handed.   Patient drinks coffee, Diet cokes about four + a week.               Social Determinants of Health   Financial Resource Strain: Not on file  Food Insecurity: Not on file  Transportation Needs: Not on file  Physical Activity: Not on file  Stress: Not on file  Social Connections: Not on file  Intimate Partner Violence: Not on file      PHYSICAL EXAM  Vitals:   07/14/22 1342  BP: 120/69  Pulse: 85  Weight: 238 lb (108 kg)  Height: '5\' 11"'$  (1.803 m)    Body mass index is 33.19 kg/m.  Generalized: Well developed, in no acute distress  Chest: Lungs clear to auscultation bilaterally  Neurological examination  Mentation: Alert oriented to time, place, history taking. Follows all commands speech and language fluent Cranial nerve II-XII:  Extraocular movements were full, visual field were full on confrontational test Head turning and shoulder shrug  were normal and symmetric. Gait and station: Gait is normal.    DIAGNOSTIC DATA (LABS, IMAGING, TESTING) - I  reviewed patient records, labs, notes, testing and imaging myself where available.  Lab Results  Component Value Date   WBC 9.0 09/10/2019   HGB 14.3 09/10/2019   HCT 44.9 09/10/2019   MCV 95.3 09/10/2019   PLT 240 09/10/2019      Component Value Date/Time   NA 138 09/10/2019 1755   K 4.4 09/10/2019 1755   CL 102 09/10/2019 1755   CO2 23 09/10/2019 1755   GLUCOSE 91 09/10/2019 1755   BUN 21 09/10/2019 1755   CREATININE 1.46 (H) 09/10/2019 1755   CALCIUM 9.1 09/10/2019 1755   PROT 7.1 09/10/2019 1755   ALBUMIN 3.9 09/10/2019 1755   AST 24 09/10/2019 1755   ALT 27 09/10/2019 1755   ALKPHOS 48 09/10/2019 1755   BILITOT 0.9 09/10/2019 1755   GFRNONAA 47 (L) 09/10/2019 1755   GFRAA 55 (L) 09/10/2019 1755   Lab Results  Component Value Date   CHOL 129 02/21/2013   HDL 33.90 (L) 02/21/2013   LDLCALC 77 02/21/2013   LDLDIRECT 156.4 04/08/2007   TRIG 93.0 02/21/2013   CHOLHDL 4 02/21/2013   Lab Results  Component Value Date   HGBA1C 6.0 02/21/2013   No results found for: "VITAMINB12" Lab Results  Component Value Date   TSH 0.90 02/21/2013      ASSESSMENT AND PLAN 76 y.o. year old male  has a past medical history of Alopecia, Diverticulosis, Dupuytren contracture, Gout, Hyperlipemia, Nephrolithiasis (9449,6759), OSA on CPAP, Sleep apnea, and Unspecified essential hypertension (05/17/2009). here with:  OSA on CPAP  - CPAP compliance excellent - Good treatment of AHI  - Encourage patient to use CPAP nightly and > 4 hours each night - F/U in 1 year or sooner if needed   Ward Givens, MSN, NP-C 07/14/2022, 11:47 AM [facility] [address], [place], [place] [address] [phone]

## 2022-07-14 ENCOUNTER — Ambulatory Visit (INDEPENDENT_AMBULATORY_CARE_PROVIDER_SITE_OTHER): Payer: Medicare Other | Admitting: Adult Health

## 2022-07-14 ENCOUNTER — Encounter: Payer: Self-pay | Admitting: Adult Health

## 2022-07-14 VITALS — BP 120/69 | HR 85 | Ht 71.0 in | Wt 238.0 lb

## 2022-07-14 DIAGNOSIS — G4733 Obstructive sleep apnea (adult) (pediatric): Secondary | ICD-10-CM | POA: Diagnosis not present

## 2022-07-14 DIAGNOSIS — Z9989 Dependence on other enabling machines and devices: Secondary | ICD-10-CM

## 2022-07-15 DIAGNOSIS — M7542 Impingement syndrome of left shoulder: Secondary | ICD-10-CM | POA: Diagnosis not present

## 2022-07-15 DIAGNOSIS — M5030 Other cervical disc degeneration, unspecified cervical region: Secondary | ICD-10-CM | POA: Diagnosis not present

## 2022-07-15 DIAGNOSIS — M25512 Pain in left shoulder: Secondary | ICD-10-CM | POA: Diagnosis not present

## 2022-08-25 DIAGNOSIS — I10 Essential (primary) hypertension: Secondary | ICD-10-CM | POA: Diagnosis not present

## 2022-08-25 DIAGNOSIS — Z125 Encounter for screening for malignant neoplasm of prostate: Secondary | ICD-10-CM | POA: Diagnosis not present

## 2022-08-25 DIAGNOSIS — R7301 Impaired fasting glucose: Secondary | ICD-10-CM | POA: Diagnosis not present

## 2022-08-25 DIAGNOSIS — E785 Hyperlipidemia, unspecified: Secondary | ICD-10-CM | POA: Diagnosis not present

## 2022-08-25 DIAGNOSIS — R7989 Other specified abnormal findings of blood chemistry: Secondary | ICD-10-CM | POA: Diagnosis not present

## 2022-08-25 DIAGNOSIS — R82998 Other abnormal findings in urine: Secondary | ICD-10-CM | POA: Diagnosis not present

## 2022-08-25 DIAGNOSIS — M109 Gout, unspecified: Secondary | ICD-10-CM | POA: Diagnosis not present

## 2022-09-03 DIAGNOSIS — Z0000 Encounter for general adult medical examination without abnormal findings: Secondary | ICD-10-CM | POA: Diagnosis not present

## 2022-09-03 DIAGNOSIS — M25512 Pain in left shoulder: Secondary | ICD-10-CM | POA: Diagnosis not present

## 2022-09-03 DIAGNOSIS — E669 Obesity, unspecified: Secondary | ICD-10-CM | POA: Diagnosis not present

## 2022-09-03 DIAGNOSIS — I129 Hypertensive chronic kidney disease with stage 1 through stage 4 chronic kidney disease, or unspecified chronic kidney disease: Secondary | ICD-10-CM | POA: Diagnosis not present

## 2022-09-03 DIAGNOSIS — E8881 Metabolic syndrome: Secondary | ICD-10-CM | POA: Diagnosis not present

## 2022-09-03 DIAGNOSIS — G5601 Carpal tunnel syndrome, right upper limb: Secondary | ICD-10-CM | POA: Diagnosis not present

## 2022-09-03 DIAGNOSIS — G4733 Obstructive sleep apnea (adult) (pediatric): Secondary | ICD-10-CM | POA: Diagnosis not present

## 2022-09-03 DIAGNOSIS — N1832 Chronic kidney disease, stage 3b: Secondary | ICD-10-CM | POA: Diagnosis not present

## 2022-09-03 DIAGNOSIS — I700 Atherosclerosis of aorta: Secondary | ICD-10-CM | POA: Diagnosis not present

## 2022-09-03 DIAGNOSIS — Z1339 Encounter for screening examination for other mental health and behavioral disorders: Secondary | ICD-10-CM | POA: Diagnosis not present

## 2022-09-03 DIAGNOSIS — M720 Palmar fascial fibromatosis [Dupuytren]: Secondary | ICD-10-CM | POA: Diagnosis not present

## 2022-09-03 DIAGNOSIS — R809 Proteinuria, unspecified: Secondary | ICD-10-CM | POA: Diagnosis not present

## 2022-09-03 DIAGNOSIS — Z1331 Encounter for screening for depression: Secondary | ICD-10-CM | POA: Diagnosis not present

## 2022-09-03 DIAGNOSIS — Z86010 Personal history of colonic polyps: Secondary | ICD-10-CM | POA: Diagnosis not present

## 2022-09-03 DIAGNOSIS — R7301 Impaired fasting glucose: Secondary | ICD-10-CM | POA: Diagnosis not present

## 2022-09-03 DIAGNOSIS — E785 Hyperlipidemia, unspecified: Secondary | ICD-10-CM | POA: Diagnosis not present

## 2022-10-01 DIAGNOSIS — D225 Melanocytic nevi of trunk: Secondary | ICD-10-CM | POA: Diagnosis not present

## 2022-10-01 DIAGNOSIS — L570 Actinic keratosis: Secondary | ICD-10-CM | POA: Diagnosis not present

## 2022-10-01 DIAGNOSIS — L821 Other seborrheic keratosis: Secondary | ICD-10-CM | POA: Diagnosis not present

## 2022-10-01 DIAGNOSIS — Z85828 Personal history of other malignant neoplasm of skin: Secondary | ICD-10-CM | POA: Diagnosis not present

## 2023-03-11 DIAGNOSIS — H5203 Hypermetropia, bilateral: Secondary | ICD-10-CM | POA: Diagnosis not present

## 2023-03-11 DIAGNOSIS — H524 Presbyopia: Secondary | ICD-10-CM | POA: Diagnosis not present

## 2023-03-11 DIAGNOSIS — H2513 Age-related nuclear cataract, bilateral: Secondary | ICD-10-CM | POA: Diagnosis not present

## 2023-03-11 DIAGNOSIS — H52203 Unspecified astigmatism, bilateral: Secondary | ICD-10-CM | POA: Diagnosis not present

## 2023-03-11 DIAGNOSIS — H25013 Cortical age-related cataract, bilateral: Secondary | ICD-10-CM | POA: Diagnosis not present

## 2023-07-13 ENCOUNTER — Encounter: Payer: Self-pay | Admitting: *Deleted

## 2023-07-13 NOTE — Progress Notes (Deleted)
PATIENT: Danny Kennedy DOB: December 28, 1945  REASON FOR VISIT: follow up HISTORY FROM: patient PRIMARY NEUROLOGIST: Dr. Vickey Huger  No chief complaint on file.    HISTORY OF PRESENT ILLNESS: Today 07/13/23:  Danny Kennedy is a 77 y.o. male with a history of OSA on CPAP. Returns today for follow-up.       07/14/22: Danny Kennedy is a 77 year old male with a history of obstructive sleep apnea on CPAP.  He returns today for follow-up.  He reports that he [place] a second [place]  This explains the gaps in his usage.  He states that he uses the machine nightly.  Reports that if he skips a night he has a sore throat.  His download is below    04/24/21: Danny Kennedy is a 77 year old male with a history of obstructive sleep apnea on CPAP.  He returns today for follow-up.  Reports that the CPAP is working well for him.  Patient denies any new issues.    HISTORY   01-03-2020:(Copied from Dr.Dohmeier's note)  Danny Kennedy, is a 77 year old Caucasian gentleman, married and longtime patient of Dr. Elsworth Soho.  He has been followed for obstructive sleep apnea in this office. He was seen in the ED for Pneumonia- had tested negative for COVID 27- son an daughter in law were infected.    Danny Kennedy has been a very compliant CPAP user and uses an air sense AutoSet machine is a serial #[id number].  He has been using the machine 67 out of 90 days and 82% over 60 days.  Average use at time of all days is 5 hours 7 minutes, minimum pressure 6 maximum pressure 12 3 cm expiratory pressure relief his residual AHI is very low at only 0.4/h.  The 95th percentile pressure is 11.8.  He does not have central apneas arising.  Based on this the patient does not need any changes in his settings.  Please note that the patient has not done the second CPAP machine that he keeps at his second home and that does protocols merged together meet up to a 97% compliance.    REVIEW OF  SYSTEMS: Out of a complete 14 system review of symptoms, the patient complains only of the following symptoms, and all other reviewed systems are negative.  ESS 4 FSS 16  ALLERGIES: No Known Allergies  HOME MEDICATIONS: Outpatient Medications Prior to Visit  Medication Sig Dispense Refill   colchicine 0.6 MG tablet Take 0.6 mg by mouth 3 (three) times daily as needed (gout flare up.).     FIBER COMPLETE TABS Take 1 tablet by mouth daily.     finasteride (PROSCAR) 5 MG tablet take 1 tablet by mouth once daily as directed (Patient taking differently: Take 2.5 mg by mouth daily.) 30 tablet 1   gabapentin (NEURONTIN) 300 MG capsule Take 300 mg by mouth at bedtime.     Glucosamine-Chondroit-Vit C-Mn (GLUCOSAMINE 1500 COMPLEX PO) Take 2 tablets by mouth daily.     Misc Natural Products (BLACK CHERRY CONCENTRATE PO) Take by mouth daily.     multivitamin (THERAGRAN) per tablet Take 1 tablet by mouth daily.       olmesartan (BENICAR) 20 MG tablet Take 20 mg by mouth daily.      pravastatin (PRAVACHOL) 40 MG tablet take 1 tablet by mouth at bedtime 90 tablet 1   No facility-administered medications prior to visit.    PAST MEDICAL HISTORY:  Past Medical History:  Diagnosis Date   Alopecia    Diverticulosis    Dupuytren contracture    RUE @ thumb   Gout    Hyperlipemia    LDL goal = < 100   Nephrolithiasis [id number]   Dr Aldean Ast, X 2    OSA on CPAP    Sleep apnea    CPAP @ 11cm   Unspecified essential hypertension 05/17/2009    PAST SURGICAL HISTORY: Past Surgical History:  Procedure Laterality Date   APPENDECTOMY     COLONOSCOPY W/ POLYPECTOMY  2005, 2007   x2 negative 10-2007, Dr Jarold Motto    IR RADIOLOGIST EVAL & MGMT  06/01/2018   LAPAROSCOPIC APPENDECTOMY N/A 05/05/2018   Procedure: APPENDECTOMY LAPAROSCOPIC;  Surgeon: Glenna Fellows, MD;  Location: [facility];  Service: General;  Laterality: N/A;   renal calculus     retirieved via basket 1984; stone passed spontaneously  2011; Otic tube R TM   WISDOM TOOTH EXTRACTION      FAMILY HISTORY: Family History  Problem Relation Age of Onset   Cancer Father        LUNG ,THROAT   Esophageal cancer Father    Heart attack Maternal Uncle        not pre 55   Heart attack Paternal Uncle        X2, neither pre 55   Hypertension Maternal Grandmother    Stroke Maternal Grandmother 72   Diabetes Maternal Grandmother    Lung cancer Maternal Grandfather    Sleep apnea Neg Hx     SOCIAL HISTORY: Social History   Socioeconomic History   Marital status: Married    Spouse name: Gaylyn Rong   Number of children: 2   Years of education: College   Highest education level: Not on file  Occupational History   Occupation: Clinical cytogeneticist: other  Tobacco Use   Smoking status: Former   Smokeless tobacco: Former    Types: Chew   Tobacco comments:    Quit 2005  Substance and Sexual Activity   Alcohol use: Yes    Alcohol/week: 2.0 standard drinks of alcohol    Types: 2 Cans of beer per week   Drug use: No   Sexual activity: Not on file  Other Topics Concern   Not on file  Social History Narrative   Patient is married Gaylyn Rong) and lives at home with his wife.   Patient owns [employer].   Patient has a college education.   Patient has two sons and four grandchildren.   Patient is right-handed.   Patient drinks coffee, Diet cokes about four + a week.               Social Determinants of Health   Financial Resource Strain: Not on file  Food Insecurity: Not on file  Transportation Needs: Not on file  Physical Activity: Not on file  Stress: Not on file  Social Connections: Unknown (03/17/2022)   Received from [facility]   Social Network    Social Network: Not on file  Intimate Partner Violence: Unknown (02/06/2022)   [facility], [facility]   HITS    Physically Hurt: Not on file    Insult or Talk Down To: Not on file    Threaten Physical Harm: Not  on file    Scream or Curse: Not on file      PHYSICAL EXAM  There were no vitals filed for this  visit.   There is no height or weight on file to calculate BMI.  Generalized: Well developed, in no acute distress  Chest: Lungs clear to auscultation bilaterally  Neurological examination  Mentation: Alert oriented to time, place, history taking. Follows all commands speech and language fluent Cranial nerve II-XII: Extraocular movements were full, visual field were full on confrontational test Head turning and shoulder shrug  were normal and symmetric. Gait and station: Gait is normal.    DIAGNOSTIC DATA (LABS, IMAGING, TESTING) - I reviewed patient records, labs, notes, testing and imaging myself where available.  Lab Results  Component Value Date   WBC 9.0 09/10/2019   HGB 14.3 09/10/2019   HCT 44.9 09/10/2019   MCV 95.3 09/10/2019   PLT 240 09/10/2019      Component Value Date/Time   NA 138 09/10/2019 1755   K 4.4 09/10/2019 1755   CL 102 09/10/2019 1755   CO2 23 09/10/2019 1755   GLUCOSE 91 09/10/2019 1755   BUN 21 09/10/2019 1755   CREATININE 1.46 (H) 09/10/2019 1755   CALCIUM 9.1 09/10/2019 1755   PROT 7.1 09/10/2019 1755   ALBUMIN 3.9 09/10/2019 1755   AST 24 09/10/2019 1755   ALT 27 09/10/2019 1755   ALKPHOS 48 09/10/2019 1755   BILITOT 0.9 09/10/2019 1755   GFRNONAA 47 (L) 09/10/2019 1755   GFRAA 55 (L) 09/10/2019 1755   Lab Results  Component Value Date   CHOL 129 02/21/2013   HDL 33.90 (L) 02/21/2013   LDLCALC 77 02/21/2013   LDLDIRECT 156.4 04/08/2007   TRIG 93.0 02/21/2013   CHOLHDL 4 02/21/2013   Lab Results  Component Value Date   HGBA1C 6.0 02/21/2013   No results found for: "VITAMINB12" Lab Results  Component Value Date   TSH 0.90 02/21/2013      ASSESSMENT AND PLAN 77 y.o. year old male  has a past medical history of Alopecia, Diverticulosis, Dupuytren contracture, Gout, Hyperlipemia, Nephrolithiasis (6962,9528), OSA on CPAP,  Sleep apnea, and Unspecified essential hypertension (05/17/2009). here with:  OSA on CPAP  - CPAP compliance excellent - Good treatment of AHI  - Encourage patient to use CPAP nightly and > 4 hours each night - F/U in 1 year or sooner if needed   Butch Penny, MSN, NP-C 07/13/2023, 12:32 PM [facility] [address], [address] [place], [place] [address] [phone]

## 2023-07-14 ENCOUNTER — Telehealth: Payer: Medicare Other | Admitting: Adult Health

## 2023-09-16 DIAGNOSIS — R7989 Other specified abnormal findings of blood chemistry: Secondary | ICD-10-CM | POA: Diagnosis not present

## 2023-09-16 DIAGNOSIS — R82998 Other abnormal findings in urine: Secondary | ICD-10-CM | POA: Diagnosis not present

## 2023-09-16 DIAGNOSIS — I129 Hypertensive chronic kidney disease with stage 1 through stage 4 chronic kidney disease, or unspecified chronic kidney disease: Secondary | ICD-10-CM | POA: Diagnosis not present

## 2023-09-16 DIAGNOSIS — M109 Gout, unspecified: Secondary | ICD-10-CM | POA: Diagnosis not present

## 2023-09-16 DIAGNOSIS — R7301 Impaired fasting glucose: Secondary | ICD-10-CM | POA: Diagnosis not present

## 2023-09-16 DIAGNOSIS — E785 Hyperlipidemia, unspecified: Secondary | ICD-10-CM | POA: Diagnosis not present

## 2023-09-16 DIAGNOSIS — N1832 Chronic kidney disease, stage 3b: Secondary | ICD-10-CM | POA: Diagnosis not present

## 2023-09-16 DIAGNOSIS — Z125 Encounter for screening for malignant neoplasm of prostate: Secondary | ICD-10-CM | POA: Diagnosis not present

## 2023-09-23 DIAGNOSIS — E785 Hyperlipidemia, unspecified: Secondary | ICD-10-CM | POA: Diagnosis not present

## 2023-09-23 DIAGNOSIS — Z23 Encounter for immunization: Secondary | ICD-10-CM | POA: Diagnosis not present

## 2023-09-23 DIAGNOSIS — Z0000 Encounter for general adult medical examination without abnormal findings: Secondary | ICD-10-CM | POA: Diagnosis not present

## 2023-09-23 DIAGNOSIS — M109 Gout, unspecified: Secondary | ICD-10-CM | POA: Diagnosis not present

## 2023-09-23 DIAGNOSIS — Z1339 Encounter for screening examination for other mental health and behavioral disorders: Secondary | ICD-10-CM | POA: Diagnosis not present

## 2023-09-23 DIAGNOSIS — E669 Obesity, unspecified: Secondary | ICD-10-CM | POA: Diagnosis not present

## 2023-09-23 DIAGNOSIS — N1832 Chronic kidney disease, stage 3b: Secondary | ICD-10-CM | POA: Diagnosis not present

## 2023-09-23 DIAGNOSIS — I129 Hypertensive chronic kidney disease with stage 1 through stage 4 chronic kidney disease, or unspecified chronic kidney disease: Secondary | ICD-10-CM | POA: Diagnosis not present

## 2023-09-23 DIAGNOSIS — G4733 Obstructive sleep apnea (adult) (pediatric): Secondary | ICD-10-CM | POA: Diagnosis not present

## 2023-09-23 DIAGNOSIS — Z1331 Encounter for screening for depression: Secondary | ICD-10-CM | POA: Diagnosis not present

## 2023-09-23 DIAGNOSIS — I700 Atherosclerosis of aorta: Secondary | ICD-10-CM | POA: Diagnosis not present

## 2023-09-23 DIAGNOSIS — E8881 Metabolic syndrome: Secondary | ICD-10-CM | POA: Diagnosis not present

## 2023-09-23 DIAGNOSIS — M720 Palmar fascial fibromatosis [Dupuytren]: Secondary | ICD-10-CM | POA: Diagnosis not present

## 2023-09-23 DIAGNOSIS — R7301 Impaired fasting glucose: Secondary | ICD-10-CM | POA: Diagnosis not present

## 2023-09-23 DIAGNOSIS — R809 Proteinuria, unspecified: Secondary | ICD-10-CM | POA: Diagnosis not present

## 2023-10-07 DIAGNOSIS — L814 Other melanin hyperpigmentation: Secondary | ICD-10-CM | POA: Diagnosis not present

## 2023-10-07 DIAGNOSIS — L918 Other hypertrophic disorders of the skin: Secondary | ICD-10-CM | POA: Diagnosis not present

## 2023-10-07 DIAGNOSIS — Z85828 Personal history of other malignant neoplasm of skin: Secondary | ICD-10-CM | POA: Diagnosis not present

## 2023-10-07 DIAGNOSIS — D224 Melanocytic nevi of scalp and neck: Secondary | ICD-10-CM | POA: Diagnosis not present

## 2023-10-07 DIAGNOSIS — L570 Actinic keratosis: Secondary | ICD-10-CM | POA: Diagnosis not present

## 2023-10-07 DIAGNOSIS — L821 Other seborrheic keratosis: Secondary | ICD-10-CM | POA: Diagnosis not present

## 2023-10-07 DIAGNOSIS — L819 Disorder of pigmentation, unspecified: Secondary | ICD-10-CM | POA: Diagnosis not present

## 2023-10-07 DIAGNOSIS — D1801 Hemangioma of skin and subcutaneous tissue: Secondary | ICD-10-CM | POA: Diagnosis not present

## 2024-03-16 DIAGNOSIS — Z460 Encounter for fitting and adjustment of spectacles and contact lenses: Secondary | ICD-10-CM | POA: Diagnosis not present

## 2024-03-16 DIAGNOSIS — H25813 Combined forms of age-related cataract, bilateral: Secondary | ICD-10-CM | POA: Diagnosis not present

## 2024-05-02 DIAGNOSIS — T63441A Toxic effect of venom of bees, accidental (unintentional), initial encounter: Secondary | ICD-10-CM | POA: Diagnosis not present

## 2024-05-02 DIAGNOSIS — Z91030 Bee allergy status: Secondary | ICD-10-CM | POA: Diagnosis not present

## 2024-06-15 DIAGNOSIS — Z85828 Personal history of other malignant neoplasm of skin: Secondary | ICD-10-CM | POA: Diagnosis not present

## 2024-06-15 DIAGNOSIS — D224 Melanocytic nevi of scalp and neck: Secondary | ICD-10-CM | POA: Diagnosis not present

## 2024-06-15 DIAGNOSIS — L905 Scar conditions and fibrosis of skin: Secondary | ICD-10-CM | POA: Diagnosis not present

## 2024-06-15 DIAGNOSIS — D485 Neoplasm of uncertain behavior of skin: Secondary | ICD-10-CM | POA: Diagnosis not present

## 2024-06-15 DIAGNOSIS — C44219 Basal cell carcinoma of skin of left ear and external auricular canal: Secondary | ICD-10-CM | POA: Diagnosis not present

## 2024-06-22 ENCOUNTER — Telehealth: Payer: Self-pay | Admitting: Adult Health

## 2024-06-22 NOTE — Telephone Encounter (Signed)
 Pt has scheduled his 1 yr f/u

## 2024-07-21 DIAGNOSIS — C44219 Basal cell carcinoma of skin of left ear and external auricular canal: Secondary | ICD-10-CM | POA: Diagnosis not present

## 2024-07-21 DIAGNOSIS — Z85828 Personal history of other malignant neoplasm of skin: Secondary | ICD-10-CM | POA: Diagnosis not present

## 2024-10-11 DIAGNOSIS — D225 Melanocytic nevi of trunk: Secondary | ICD-10-CM | POA: Diagnosis not present

## 2024-10-11 DIAGNOSIS — L570 Actinic keratosis: Secondary | ICD-10-CM | POA: Diagnosis not present

## 2024-10-11 DIAGNOSIS — Z85828 Personal history of other malignant neoplasm of skin: Secondary | ICD-10-CM | POA: Diagnosis not present

## 2024-10-11 DIAGNOSIS — L821 Other seborrheic keratosis: Secondary | ICD-10-CM | POA: Diagnosis not present

## 2024-10-11 DIAGNOSIS — L905 Scar conditions and fibrosis of skin: Secondary | ICD-10-CM | POA: Diagnosis not present

## 2025-01-02 ENCOUNTER — Ambulatory Visit: Admitting: Adult Health

## 2025-03-16 ENCOUNTER — Ambulatory Visit: Admitting: Adult Health
